# Patient Record
Sex: Male | Born: 1955
Health system: Southern US, Community
[De-identification: ages and names within clinical notes are randomized; demographics above are authoritative.]

## PROBLEM LIST (undated history)

## (undated) DIAGNOSIS — J45909 Unspecified asthma, uncomplicated: Secondary | ICD-10-CM

## (undated) DIAGNOSIS — F32A Depression, unspecified: Secondary | ICD-10-CM

## (undated) DIAGNOSIS — G4730 Sleep apnea, unspecified: Secondary | ICD-10-CM

## (undated) DIAGNOSIS — D1720 Benign lipomatous neoplasm of skin and subcutaneous tissue of unspecified limb: Secondary | ICD-10-CM

## (undated) DIAGNOSIS — C499 Malignant neoplasm of connective and soft tissue, unspecified: Secondary | ICD-10-CM

## (undated) DIAGNOSIS — F329 Major depressive disorder, single episode, unspecified: Secondary | ICD-10-CM

## (undated) DIAGNOSIS — E119 Type 2 diabetes mellitus without complications: Secondary | ICD-10-CM

## (undated) HISTORY — PX: COLONOSCOPY: SHX174

## (undated) HISTORY — DX: Unspecified asthma, uncomplicated: J45.909

## (undated) HISTORY — DX: Depression, unspecified: F32.A

## (undated) HISTORY — DX: Sleep apnea, unspecified: G47.30

## (undated) HISTORY — DX: Benign lipomatous neoplasm of skin and subcutaneous tissue of unspecified limb: D17.20

## (undated) HISTORY — DX: Malignant neoplasm of connective and soft tissue, unspecified: C49.9

## (undated) HISTORY — DX: Type 2 diabetes mellitus without complications: E11.9

---
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
## 1998-10-10 HISTORY — PX: INGUINAL HERNIA REPAIR: SHX194

## 1999-08-17 ENCOUNTER — Ambulatory Visit (HOSPITAL_COMMUNITY): Admission: RE | Admit: 1999-08-17 | Discharge: 1999-08-17 | Payer: Self-pay | Admitting: Surgery

## 2004-09-20 ENCOUNTER — Ambulatory Visit: Payer: Self-pay | Admitting: Internal Medicine

## 2004-10-12 ENCOUNTER — Ambulatory Visit: Payer: Self-pay | Admitting: Internal Medicine

## 2004-10-19 ENCOUNTER — Ambulatory Visit: Payer: Self-pay | Admitting: Internal Medicine

## 2005-06-15 ENCOUNTER — Ambulatory Visit: Payer: Self-pay | Admitting: Internal Medicine

## 2005-06-29 ENCOUNTER — Ambulatory Visit: Payer: Self-pay | Admitting: Internal Medicine

## 2005-09-26 ENCOUNTER — Ambulatory Visit: Payer: Self-pay | Admitting: Internal Medicine

## 2006-08-08 ENCOUNTER — Ambulatory Visit: Payer: Self-pay | Admitting: Internal Medicine

## 2006-09-18 ENCOUNTER — Ambulatory Visit: Payer: Self-pay | Admitting: Internal Medicine

## 2006-09-18 LAB — CONVERTED CEMR LAB
Chol/HDL Ratio, serum: 4.9
Cholesterol: 207 mg/dL (ref 0–200)
GFR calc non Af Amer: 57 mL/min
Glomerular Filtration Rate, Af Am: 70 mL/min/{1.73_m2}
Glucose, Bld: 96 mg/dL (ref 70–99)
PSA: 0.34 ng/mL (ref 0.10–4.00)
Potassium: 4.3 meq/L (ref 3.5–5.1)
Sodium: 141 meq/L (ref 135–145)
Triglyceride fasting, serum: 137 mg/dL (ref 0–149)
VLDL: 27 mg/dL (ref 0–40)

## 2007-09-20 ENCOUNTER — Ambulatory Visit: Payer: Self-pay | Admitting: Internal Medicine

## 2008-05-21 ENCOUNTER — Telehealth (INDEPENDENT_AMBULATORY_CARE_PROVIDER_SITE_OTHER): Payer: Self-pay | Admitting: *Deleted

## 2008-05-21 ENCOUNTER — Ambulatory Visit: Payer: Self-pay | Admitting: Internal Medicine

## 2009-01-26 ENCOUNTER — Ambulatory Visit: Payer: Self-pay | Admitting: Internal Medicine

## 2009-01-29 LAB — CONVERTED CEMR LAB
ALT: 28 units/L (ref 0–53)
AST: 23 units/L (ref 0–37)
BUN: 15 mg/dL (ref 6–23)
Cholesterol: 214 mg/dL — ABNORMAL HIGH (ref 0–200)
Creatinine, Ser: 1.2 mg/dL (ref 0.4–1.5)
Eosinophils Relative: 2 % (ref 0.0–5.0)
GFR calc non Af Amer: 82.19 mL/min (ref >60)
HDL: 43 mg/dL (ref >39.00)
Monocytes Relative: 7.9 % (ref 3.0–12.0)
Neutrophils Relative %: 50.9 % (ref 43.0–77.0)
Platelets: 252 10*3/uL (ref 150.0–400.0)
Potassium: 4.2 meq/L (ref 3.5–5.1)
TSH: 0.84 microintl units/mL (ref 0.35–5.50)
Triglycerides: 179 mg/dL — ABNORMAL HIGH (ref 0.0–149.0)
VLDL: 35.8 mg/dL (ref 0.0–40.0)
WBC: 5.4 10*3/uL (ref 4.5–10.5)

## 2009-09-30 ENCOUNTER — Encounter (INDEPENDENT_AMBULATORY_CARE_PROVIDER_SITE_OTHER): Payer: Self-pay | Admitting: *Deleted

## 2010-05-17 ENCOUNTER — Ambulatory Visit: Payer: Self-pay | Admitting: Internal Medicine

## 2010-05-18 ENCOUNTER — Encounter (INDEPENDENT_AMBULATORY_CARE_PROVIDER_SITE_OTHER): Payer: Self-pay | Admitting: *Deleted

## 2010-05-18 ENCOUNTER — Telehealth: Payer: Self-pay | Admitting: Internal Medicine

## 2010-05-19 ENCOUNTER — Telehealth (INDEPENDENT_AMBULATORY_CARE_PROVIDER_SITE_OTHER): Payer: Self-pay | Admitting: *Deleted

## 2010-05-20 ENCOUNTER — Encounter: Payer: Self-pay | Admitting: Internal Medicine

## 2010-05-21 LAB — CONVERTED CEMR LAB
Albumin: 3.9 g/dL (ref 3.5–5.2)
Alkaline Phosphatase: 44 units/L (ref 39–117)
Basophils Absolute: 0 10*3/uL (ref 0.0–0.1)
CO2: 24 meq/L (ref 19–32)
Calcium: 8.9 mg/dL (ref 8.4–10.5)
Creatinine, Ser: 1.2 mg/dL (ref 0.4–1.5)
Eosinophils Absolute: 0.1 10*3/uL (ref 0.0–0.7)
Glucose, Bld: 92 mg/dL (ref 70–99)
HDL: 43.8 mg/dL (ref >39.00)
Hemoglobin: 13.9 g/dL (ref 13.0–17.0)
Lymphocytes Relative: 64.8 % — ABNORMAL HIGH (ref 12.0–46.0)
MCHC: 33.8 g/dL (ref 30.0–36.0)
Neutro Abs: 0.4 10*3/uL — ABNORMAL LOW (ref 1.4–7.7)
Neutrophils Relative %: 17.9 % — ABNORMAL LOW (ref 43.0–77.0)
PSA: 0.41 ng/mL (ref 0.10–4.00)
RDW: 15.2 % — ABNORMAL HIGH (ref 11.5–14.6)
Triglycerides: 186 mg/dL — ABNORMAL HIGH (ref 0.0–149.0)
VLDL: 37.2 mg/dL (ref 0.0–40.0)

## 2010-05-24 ENCOUNTER — Telehealth: Payer: Self-pay | Admitting: Internal Medicine

## 2010-05-26 ENCOUNTER — Telehealth (INDEPENDENT_AMBULATORY_CARE_PROVIDER_SITE_OTHER): Payer: Self-pay | Admitting: *Deleted

## 2010-05-26 DIAGNOSIS — D72819 Decreased white blood cell count, unspecified: Secondary | ICD-10-CM | POA: Insufficient documentation

## 2010-05-31 ENCOUNTER — Encounter: Payer: Self-pay | Admitting: Internal Medicine

## 2010-06-22 ENCOUNTER — Ambulatory Visit: Payer: Self-pay | Admitting: Internal Medicine

## 2010-06-28 LAB — CONVERTED CEMR LAB
Basophils Absolute: 0 10*3/uL (ref 0.0–0.1)
Eosinophils Absolute: 0.1 10*3/uL (ref 0.0–0.7)
HCT: 40 % (ref 39.0–52.0)
Hemoglobin: 13.5 g/dL (ref 13.0–17.0)
Lymphs Abs: 1.9 10*3/uL (ref 0.7–4.0)
MCHC: 33.8 g/dL (ref 30.0–36.0)
MCV: 82.1 fL (ref 78.0–100.0)
Monocytes Absolute: 0.3 10*3/uL (ref 0.1–1.0)
Neutro Abs: 2.9 10*3/uL (ref 1.4–7.7)
RDW: 15.8 % — ABNORMAL HIGH (ref 11.5–14.6)

## 2010-07-22 ENCOUNTER — Encounter (INDEPENDENT_AMBULATORY_CARE_PROVIDER_SITE_OTHER): Payer: Self-pay | Admitting: *Deleted

## 2010-07-26 ENCOUNTER — Ambulatory Visit: Payer: Self-pay | Admitting: Internal Medicine

## 2010-08-05 ENCOUNTER — Ambulatory Visit: Payer: Self-pay | Admitting: Internal Medicine

## 2010-11-07 LAB — CONVERTED CEMR LAB
AST: 22 units/L (ref 0–37)
BUN: 14 mg/dL (ref 6–23)
Basophils Relative: 0 % (ref 0.0–1.0)
CO2: 26 meq/L (ref 19–32)
Creatinine, Ser: 1.3 mg/dL (ref 0.4–1.5)
Eosinophils Relative: 1.6 % (ref 0.0–5.0)
Glucose, Bld: 107 mg/dL — ABNORMAL HIGH (ref 70–99)
HCT: 41 % (ref 39.0–52.0)
Hemoglobin: 14 g/dL (ref 13.0–17.0)
Lymphocytes Relative: 41.2 % (ref 12.0–46.0)
Monocytes Absolute: 0.4 10*3/uL (ref 0.2–0.7)
Monocytes Relative: 7.5 % (ref 3.0–11.0)
Neutrophils Relative %: 49.7 % (ref 43.0–77.0)
PSA: 0.36 ng/mL (ref 0.10–4.00)
Potassium: 3.9 meq/L (ref 3.5–5.1)
RDW: 13.9 % (ref 11.5–14.6)
TSH: 0.52 microintl units/mL (ref 0.35–5.50)
Total CHOL/HDL Ratio: 5.3
VLDL: 31 mg/dL (ref 0–40)
WBC: 4.8 10*3/uL (ref 4.5–10.5)

## 2010-11-11 NOTE — Letter (Signed)
Summary: Previsit letter  [facility]  [address] [place], [place] [address]   Phone: [phone]  Fax: [phone]       05/31/2010 MRN: [id number]  [facility] [address] [place], [place]  [address]  Dear Mr. [facility],  Welcome to the Gastroenterology Division at [facility].    You are scheduled to see a nurse for your pre-procedure visit on  07-22-10 at 9am on the 3rd floor at [facility], [address]  We ask that you try to arrive at our office 15 minutes prior to your appointment time to allow for check-in.  Your nurse visit will consist of discussing your medical and surgical history, your immediate family medical history, and your medications.    Please bring a complete list of all your medications or, if you prefer, bring the medication bottles and we will list them.  We will need to be aware of both prescribed and over the counter drugs.  We will need to know exact dosage information as well.  If you are on blood thinners (Coumadin, Plavix, Aggrenox, Ticlid, etc.) please call our office today/prior to your appointment, as we need to consult with your physician about holding your medication.   Please be prepared to read and sign documents such as consent forms, a financial agreement, and acknowledgement forms.  If necessary, and with your consent, a friend or relative is welcome to sit-in on the nurse visit with you.  Please bring your insurance card so that we may make a copy of it.  If your insurance requires a referral to see a specialist, please bring your referral form from your primary care physician.  No co-pay is required for this nurse visit.     If you cannot keep your appointment, please call [phone] to cancel or reschedule prior to your appointment date.  This allows [place] the opportunity to schedule an appointment for another patient in need of care.    Thank you for choosing [facility] for your medical needs.  We  appreciate the opportunity to care for you.  Please visit [place] at our website  to learn more about our practice.                     Sincerely.                                                                                                                   The Gastroenterology Division

## 2010-11-11 NOTE — Progress Notes (Signed)
Summary: Schedule Colonoscopy  Phone Note Outgoing Call Call back at [phone]   Call placed by: Harlow Mares CMA Duncan Dull),  May 24, 2010 11:27 AM Call placed to: Patient Summary of Call: Left message on patients machine to call back. patient is due for a colonoscopy due to family hx of colon cancer Initial call taken by: Harlow Mares CMA Duncan Dull),  May 24, 2010 11:27 AM  Follow-up for Phone Call        colonoscopy on 08/05/2010 Follow-up by: Harlow Mares CMA Duncan Dull),  May 31, 2010 9:18 AM

## 2010-11-11 NOTE — Letter (Signed)
Summary: Primary Care Consult Scheduled Letter  [facility]  [address] [place], [place] [address]   Phone: [phone]  Fax: [phone]      05/18/2010 MRN: [id number]  [facility] [address] [place], [place]  [address]    Dear Gary [name],    We have scheduled an appointment for you.  At the recommendation of Dr. Willow Ora, we have scheduled you for a Screening Colonoscopy with [facility].  Your initial Pre-visit with the Nurse is on 07-22-2010 at 9:00am.  Your Colonoscopy is on 08-05-2010 arrive no later than 8:00am with Dr. Stan Head.  Their address is [address], [place] [address]. The office phone number is [phone].  If this appointment day and time is not convenient for you, please feel free to call the office of the doctor you are being referred to at the number listed above and reschedule the appointment.    It is important for you to keep your scheduled appointments. We are here to make sure you are given good patient care.   Thank you,    Renee, Patient Care Coordinator [facility]

## 2010-11-11 NOTE — Letter (Signed)
Summary: Provider Notification Form/[employer]  Provider Notification Form/[employer]   Imported By: Lanelle Bal 05/31/2010 11:11:15  _____________________________________________________________________  External Attachment:    Type:   Image     Comment:   External Document

## 2010-11-11 NOTE — Letter (Signed)
Summary: [facility] Instructions  [facility]  [address] [place], [place] [address]   Phone: [phone]  Fax: [phone]       Gary [name]    30-Apr-1956    MRN: [id number]        Procedure Day /Date:  Thursday 08/05/2010     Arrival Time: 8:00 am      Procedure Time: 9:00 am     Location of Procedure:                    _x _   Endoscopy Center (4th Floor)                        PREPARATION FOR COLONOSCOPY WITH MOVIPREP   Starting 5 days prior to your procedure Saturday 10/22 do not eat nuts, seeds, popcorn, corn, beans, peas,  salads, or any raw vegetables.  Do not take any fiber supplements (e.g. Metamucil, Citrucel, and Benefiber).  THE DAY BEFORE YOUR PROCEDURE         DATE: Wednesday 10/26  1.  Drink clear liquids the entire day-NO SOLID FOOD  2.  Do not drink anything colored red or purple.  Avoid juices with pulp.  No orange juice.  3.  Drink at least 64 oz. (8 glasses) of fluid/clear liquids during the day to prevent dehydration and help the prep work efficiently.  CLEAR LIQUIDS INCLUDE: Water Jello Ice Popsicles Tea (sugar ok, no milk/cream) Powdered fruit flavored drinks Coffee (sugar ok, no milk/cream) Gatorade Juice: apple, white grape, white cranberry  Lemonade Clear bullion, consomm, broth Carbonated beverages (any kind) Strained chicken noodle soup Hard Candy                             4.  In the morning, mix first dose of MoviPrep solution:    Empty 1 Pouch A and 1 Pouch B into the disposable container    Add lukewarm drinking water to the top line of the container. Mix to dissolve    Refrigerate (mixed solution should be used within 24 hrs)  5.  Begin drinking the prep at 5:00 p.m. The MoviPrep container is divided by 4 marks.   Every 15 minutes drink the solution down to the next mark (approximately 8 oz) until the full liter is complete.   6.  Follow completed prep with 16 oz of clear liquid of your choice  (Nothing red or purple).  Continue to drink clear liquids until bedtime.  7.  Before going to bed, mix second dose of MoviPrep solution:    Empty 1 Pouch A and 1 Pouch B into the disposable container    Add lukewarm drinking water to the top line of the container. Mix to dissolve    Refrigerate  THE DAY OF YOUR PROCEDURE      DATE: Thursday 10/27  Beginning at 4:00 a.m. (5 hours before procedure):         1. Every 15 minutes, drink the solution down to the next mark (approx 8 oz) until the full liter is complete.  2. Follow completed prep with 16 oz. of clear liquid of your choice.    3. You may drink clear liquids until 7:00 am (2 HOURS BEFORE PROCEDURE).   MEDICATION INSTRUCTIONS  Unless otherwise instructed, you should take regular prescription medications with a small sip of water   as early as possible the morning of  your procedure.           OTHER INSTRUCTIONS  You will need a responsible adult at least 53 years of age to accompany you and drive you home.   This person must remain in the waiting room during your procedure.  Wear loose fitting clothing that is easily removed.  Leave jewelry and other valuables at home.  However, you may wish to bring a book to read or  an iPod/MP3 player to listen to music as you wait for your procedure to start.  Remove all body piercing jewelry and leave at home.  Total time from sign-in until discharge is approximately 2-3 hours.  You should go home directly after your procedure and rest.  You can resume normal activities the  day after your procedure.  The day of your procedure you should not:   Drive   Make legal decisions   Operate machinery   Drink alcohol   Return to work  You will receive specific instructions about eating, activities and medications before you leave.    The above instructions have been reviewed and explained to me by   Ezra Sites RN  July 26, 2010 9:14 AM     I fully understand  and can verbalize these instructions _____________________________ Date _________

## 2010-11-11 NOTE — Progress Notes (Signed)
Summary: lab result  Phone Note From Other Clinic   Summary of Call: Clydie Braun from [place] lab called to give [place] this result, will fax over results.  WBC- 2.5  Follow-up for Phone Call        noted, patient asx , will see the full CBC  when available Jose E. Paz MD  May 18, 2010 4:42 PM  see labs [place] E. Paz MD  May 20, 2010 6:49 PM

## 2010-11-11 NOTE — Assessment & Plan Note (Signed)
Summary: cpx/cbs   Vital Signs:  Patient profile:   55 year old male Height:      73 inches Weight:      279.13 pounds BMI:     36.96 Pulse rate:   70 / minute Pulse rhythm:   regular BP sitting:   116 / 80  (left arm) Cuff size:   large  Vitals Entered By: Army Fossa CMA (May 17, 2010 1:57 PM)  [facility]: CPX: fasting Comments due for colonoscopy.    History of Present Illness: complete physical exam  Preventive Screening-Counseling & Management  Caffeine-Diet-Exercise     Type of exercise: bike, weights     Times/week: 3  Current Medications (verified): 1)  None  Allergies (verified): No Known Drug Allergies  Past History:  Past Medical History: Reviewed history from 09/20/2007 and no changes required. no  Past Surgical History: Reviewed history from 09/20/2007 and no changes required. Inguinal herniorrhaphy  Family History: Reviewed history from 01/26/2009 and no changes required. Hypertension: MOTHER Colon CA 1st degree relative <60: FATHER Diabetes--MOTHER MI--no prostate ca--no  Social History: Married 4 kids Occupation: works for the [employer] (not sedentary) tobacco--no ETOH-- rarely diet-- has "cut down" fried food  exercise--walks 3/week, weigh lifting 2/week  Review of Systems CV:  Denies chest pain or discomfort, palpitations, shortness of breath with exertion, and swelling of feet. Resp:  Denies cough and wheezing. GI:  Denies bloody stools, diarrhea, nausea, and vomiting. GU:  Denies dysuria, hematuria, urinary frequency, and urinary hesitancy. Psych:  Denies anxiety and depression.  Physical Exam  General:  alert, well-developed, and overweight-appearing.   Neck:  no masses, no thyromegaly, and normal carotid upstroke.   Lungs:  normal respiratory effort, no intercostal retractions, no accessory muscle use, and normal breath sounds.   Heart:  normal rate, regular rhythm, no murmur, and no gallop.   Abdomen:  soft,  non-tender, no distention, no masses, no guarding, no rigidity, and no rebound tenderness.   Rectal:  several skin tags noted. Normal sphincter tone. No rectal masses or tenderness. no stools found Prostate:  exam quite limited by patient's size, prostate feels normal Extremities:  note lower extremity edema Neurologic:  alert & oriented X3, strength normal in all extremities, and gait normal.   Psych:  Cognition and judgment appear intact. Alert and cooperative with normal attention span and concentration. not anxious appearing and not depressed appearing.     Impression & Recommendations:  Problem # 1:  HEALTH SCREENING (ICD-V70.0) Td 01-2009  Cscope 2006 (-) no polyps, d/t  his family history of colon cancer he was recommended to have his next Cscope  2011 -------------> refer  to GI, the patient is somehow reluctant to have another colonoscopy but he agreed to to schedule one later this year  diet and exercise discussed, his BMI continued to be high. Weight Watchers? Labs    Orders: Venipuncture ([address]) TLB-BMP (Basic Metabolic Panel-BMET) (80048-METABOL) TLB-CBC Platelet - w/Differential (85025-CBCD) TLB-Hepatic/Liver Function Pnl (80076-HEPATIC) TLB-Lipid Panel (80061-LIPID) TLB-TSH (Thyroid Stimulating Hormone) (84443-TSH) TLB-PSA (Prostate Specific Antigen) (84153-PSA) Gastroenterology Referral (GI)  Patient Instructions: 1)  Please schedule a follow-up appointment in 1 year.    Risk Factors:  Alcohol use:  yes Exercise:  yes    Times per week:  3    Type:  bike, weights

## 2010-11-11 NOTE — Miscellaneous (Signed)
Summary: [facility]  Clinical Lists Changes  Medications: Added new medication of MOVIPREP 100 GM  SOLR (PEG-KCL-NACL-NASULF-NA ASC-C) As per prep instructions. - Signed Rx of MOVIPREP 100 GM  SOLR (PEG-KCL-NACL-NASULF-NA ASC-C) As per prep instructions.;  #1 x 0;  Signed;  Entered by: Ezra Sites RN;  Authorized by: Iva Boop MD, FACG;  Method used: Electronically to [id number] [address] [place], [place]  [address], Ph: [phone], Fax: [phone] Observations: Added new observation of NKA: T (07/26/2010 8:43)    Prescriptions: MOVIPREP 100 GM  SOLR (PEG-KCL-NACL-NASULF-NA ASC-C) As per prep instructions.  #1 x 0   Entered by:   Ezra Sites RN   Authorized by:   Iva Boop MD, [facility]   Signed by:   Ezra Sites RN on 07/26/2010   Method used:   Electronically to        [id number] (retail)       [address] [place], [place]  [address]       Ph: [phone]       Fax: [phone]   RxID:   [id number]

## 2010-11-11 NOTE — Procedures (Signed)
Summary: Colonoscopy  Patient: Gary [name] Note: All result statuses are Final unless otherwise noted.  Tests: (1) Colonoscopy (COL)   COL Colonoscopy           DONE     [facility]     [address]     [place], [place]  [address]           COLONOSCOPY PROCEDURE REPORT           PATIENT:  Gary [name], Gary [name]  MR#:  [id number]     BIRTHDATE:  22-Nov-1955, 55 yrs. old  GENDER:  male     ENDOSCOPIST:  Iva Boop, MD, [facility]           PROCEDURE DATE:  08/05/2010     PROCEDURE:  Colonoscopy 469[address]     ASA CLASS:  Class I     INDICATIONS:  Elevated Risk Screening, family history of colon     cancer Father, dx age 77     MEDICATIONS:   Fentanyl 75 mcg IV, Versed 6 mg IV           DESCRIPTION OF PROCEDURE:   After the risks benefits and     alternatives of the procedure were thoroughly explained, informed     consent was obtained.  Digital rectal exam was performed and     revealed no abnormalities and normal prostate.   The [id number] [id number]     endoscope was introduced through the anus and advanced to the     cecum, which was identified by both the appendix and ileocecal     valve, without limitations.  The quality of the prep was     excellent, using MoviPrep.  The instrument was then slowly     withdrawn as the colon was fully examined. Insertion: 4:13 minutes     Withdrawal: 8:30 minutes     <<PROCEDUREIMAGES>>           FINDINGS:  A normal appearing cecum, ileocecal valve, and     appendiceal orifice were identified. The ascending, hepatic     flexure, transverse, splenic flexure, descending, sigmoid colon,     and rectum appeared unremarkable.   Retroflexed views in the     rectum revealed no abnormalities.    The scope was then withdrawn     from the patient and the procedure completed.           COMPLICATIONS:  None     ENDOSCOPIC IMPRESSION:     1) Normal colonoscopy, excellent prep     2) Family history of colon cancer - father           REPEAT EXAM:  In 5 year(s) for  routine screening colonoscopy.           Iva Boop, MD, [facility]           CC:  The Patient           n.     eSIGNED:   Iva Boop at 08/05/2010 09:33 AM           Bronson Ing, [id number]  Note: An exclamation mark (!) indicates a result that was not dispersed into the flowsheet. Document Creation Date: 08/05/2010 9:34 AM _______________________________________________________________________  (1) Order result status: Final Collection or observation date-time: 08/05/2010 09:29 Requested date-time:  Receipt date-time:  Reported date-time:  Referring Physician:   Ordering Physician: Stan Head [id number]) Specimen Source:  Source: Launa Grill Order Number: [id number] Lab site:  Appended Document: Colonoscopy    Clinical Lists Changes  Observations: Added new observation of COLONNXTDUE: 07/2015 (08/05/2010 12:35)

## 2010-11-11 NOTE — Progress Notes (Signed)
Summary: "REWARDS FOR HEALTH PROGRAM" PAPERWORK TO COMPLETE  Phone Note Call from Patient Call back at [phone]   Caller: Patient Complaint: Breathing Problems Summary of Call: PATIENT BROUGHT IN PAPERWORK "REWARDS FOR HEALTH PROGRAM"  WILL TAKE FORM AND BILLING SHEET TO DANIELLE--PLEASE RETURN ALL SHEETS TO [facility] Initial call taken by: Jerolyn Shin,  May 19, 2010 3:44 PM  Follow-up for Phone Call        filled out form, placed on your ledge for you to sign. Army Fossa CMA  May 19, 2010 4:03 PM   Additional Follow-up for Phone Call Additional follow up Details #1::        done [facility] E. 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

## 2010-11-11 NOTE — Progress Notes (Signed)
----   Converted from flag ---- ---- 05/26/2010 8:35 AM, [name] MD wrote: leukopenia  ---- 05/25/2010 9:37 AM, Army Fossa CMA wrote: only thing on problem list is health screening- what code do you want [place] to use to recheck his CBC (his WBC was low) ------------------------------     New Problems: LEUKOPENIA, MILD (ICD-288.50)   New Problems: LEUKOPENIA, MILD (ICD-288.50)

## 2011-06-09 ENCOUNTER — Encounter: Payer: Self-pay | Admitting: *Deleted

## 2011-06-10 ENCOUNTER — Ambulatory Visit (INDEPENDENT_AMBULATORY_CARE_PROVIDER_SITE_OTHER): Payer: 59 | Admitting: Internal Medicine

## 2011-06-10 ENCOUNTER — Encounter: Payer: Self-pay | Admitting: Internal Medicine

## 2011-06-10 VITALS — BP 102/68 | HR 64 | Temp 97.8°F | Resp 16 | Ht 71.25 in | Wt 287.2 lb

## 2011-06-10 DIAGNOSIS — R21 Rash and other nonspecific skin eruption: Secondary | ICD-10-CM | POA: Insufficient documentation

## 2011-06-10 DIAGNOSIS — Z136 Encounter for screening for cardiovascular disorders: Secondary | ICD-10-CM

## 2011-06-10 DIAGNOSIS — Z0000 Encounter for general adult medical examination without abnormal findings: Secondary | ICD-10-CM | POA: Insufficient documentation

## 2011-06-10 LAB — LIPID PANEL
HDL: 47.2 mg/dL (ref >39.00)
Total CHOL/HDL Ratio: 5
VLDL: 33.6 mg/dL (ref 0.0–40.0)

## 2011-06-10 LAB — CBC WITH DIFFERENTIAL/PLATELET
Basophils Absolute: 0 10*3/uL (ref 0.0–0.1)
Basophils Relative: 0.7 % (ref 0.0–3.0)
Eosinophils Relative: 3.1 % (ref 0.0–5.0)
HCT: 44.5 % (ref 39.0–52.0)
Hemoglobin: 14.5 g/dL (ref 13.0–17.0)
Lymphocytes Relative: 35 % (ref 12.0–46.0)
Lymphs Abs: 1.7 10*3/uL (ref 0.7–4.0)
Monocytes Relative: 6.8 % (ref 3.0–12.0)
Neutro Abs: 2.7 10*3/uL (ref 1.4–7.7)
RBC: 5.41 Mil/uL (ref 4.22–5.81)
RDW: 15.1 % — ABNORMAL HIGH (ref 11.5–14.6)
WBC: 4.9 10*3/uL (ref 4.5–10.5)

## 2011-06-10 LAB — PSA: PSA: 0.35 ng/mL (ref 0.10–4.00)

## 2011-06-10 LAB — COMPREHENSIVE METABOLIC PANEL
ALT: 45 U/L (ref 0–53)
BUN: 17 mg/dL (ref 6–23)
CO2: 25 mEq/L (ref 19–32)
Calcium: 9 mg/dL (ref 8.4–10.5)
Chloride: 108 mEq/L (ref 96–112)
Creatinine, Ser: 1.1 mg/dL (ref 0.4–1.5)
GFR: 89.09 mL/min (ref >60.00)
Total Bilirubin: 1.1 mg/dL (ref 0.3–1.2)

## 2011-06-10 LAB — LDL CHOLESTEROL, DIRECT: Direct LDL: 133.5 mg/dL

## 2011-06-10 NOTE — Assessment & Plan Note (Signed)
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

## 2011-06-10 NOTE — Progress Notes (Signed)
  Subjective:    Patient ID: Gary [name], male    DOB: Oct 17, 1955, 55 y.o.   MRN: [id number]  HPI CPX Has a rash , L groin x months, occ itches ; on further questioning the area has been looking darker x a while   Past Medical History  Diagnosis Date  . Family history unremarkable     Past Surgical History  Procedure Date  . Inguinal hernia repair     Family History: Hypertension: MOTHER Colon CA 1st degree relative <60: FATHER (passed) Diabetes--MOTHER MI--no prostate ca--no  Social History: Married, 4 kids Occupation: works for the [employer] (not sedentary) tobacco--no ETOH-- rarely diet--not very healthy lately   exercise--active at work, not as active as before  Review of Systems  Constitutional: Negative for fever and fatigue.  Respiratory: Negative for cough and shortness of breath.   Cardiovascular: Negative for chest pain and leg swelling.  Gastrointestinal: Negative for nausea, vomiting and abdominal pain.  Genitourinary: Negative for dysuria, hematuria and difficulty urinating.  Psychiatric/Behavioral:       No anxiety-depression        Objective:   Physical Exam  Constitutional: He is oriented to person, place, and time. He appears well-developed and well-nourished. No distress.  HENT:  Head: Normocephalic and atraumatic.  Neck: No thyromegaly present.  Cardiovascular: Normal rate, regular rhythm and normal heart sounds.   No murmur heard. Pulmonary/Chest: Effort normal and breath sounds normal. No respiratory distress. He has no wheezes.  Abdominal: Soft. Bowel sounds are normal. He exhibits no distension. There is no tenderness. There is no rebound and no guarding.  Genitourinary: Rectum normal.       Prostate hard to reach d/t pt habitus but felt normal  Musculoskeletal: He exhibits no edema.  Neurological: He is alert and oriented to person, place, and time.  Skin: He is not diaphoretic.       Symmetric velvety aspect of the groins , on the  left there is a superimposed red rash w/ elevated, well demarcated and slt scally borders    Psychiatric: He has a normal mood and affect. His behavior is normal. Judgment and thought content normal.          Assessment & Plan:

## 2011-06-10 NOTE — Assessment & Plan Note (Addendum)
Td 01-2009 Cscope 2006 (-) no polyps, second Cscope 07-2010 (-), next 5 years d/t FH (Dr Leone Payor) diet and exercise discussed, his BMI continued to be high.   Labs  EKG nsr

## 2011-06-10 NOTE — Patient Instructions (Signed)
Lamisil OTC twice a day x 1 month to the Left groin even if it looks better

## 2011-08-26 ENCOUNTER — Telehealth: Payer: Self-pay

## 2011-08-26 NOTE — Telephone Encounter (Signed)
Left message to notify pt docs ready to pick up

## 2012-02-03 ENCOUNTER — Ambulatory Visit (HOSPITAL_COMMUNITY)
Admission: RE | Admit: 2012-02-03 | Discharge: 2012-02-03 | Disposition: A | Discharge status: Home / Self Care | Payer: 59 | Source: Ambulatory Visit | Attending: Chiropractic Medicine | Admitting: Chiropractic Medicine

## 2012-02-03 ENCOUNTER — Other Ambulatory Visit (HOSPITAL_COMMUNITY): Payer: Self-pay | Admitting: Chiropractic Medicine

## 2012-02-03 DIAGNOSIS — M545 Low back pain, unspecified: Secondary | ICD-10-CM | POA: Insufficient documentation

## 2012-02-03 DIAGNOSIS — M25569 Pain in unspecified knee: Secondary | ICD-10-CM | POA: Insufficient documentation

## 2012-02-03 DIAGNOSIS — M25469 Effusion, unspecified knee: Secondary | ICD-10-CM | POA: Insufficient documentation

## 2012-02-03 DIAGNOSIS — M5380 Other specified dorsopathies, site unspecified: Secondary | ICD-10-CM | POA: Insufficient documentation

## 2012-02-07 ENCOUNTER — Other Ambulatory Visit (HOSPITAL_COMMUNITY): Payer: Self-pay | Admitting: Chiropractic Medicine

## 2012-05-10 ENCOUNTER — Encounter: Payer: Self-pay | Admitting: Internal Medicine

## 2012-05-10 ENCOUNTER — Ambulatory Visit (INDEPENDENT_AMBULATORY_CARE_PROVIDER_SITE_OTHER): Payer: 59 | Admitting: Internal Medicine

## 2012-05-10 VITALS — BP 124/82 | HR 56 | Temp 97.9°F | Ht 72.75 in | Wt 266.0 lb

## 2012-05-10 DIAGNOSIS — Z0000 Encounter for general adult medical examination without abnormal findings: Secondary | ICD-10-CM

## 2012-05-10 LAB — CBC WITH DIFFERENTIAL/PLATELET
Basophils Absolute: 0 10*3/uL (ref 0.0–0.1)
Eosinophils Relative: 2 % (ref 0.0–5.0)
Monocytes Relative: 8.7 % (ref 3.0–12.0)
Neutrophils Relative %: 53 % (ref 43.0–77.0)
Platelets: 259 10*3/uL (ref 150.0–400.0)
RDW: 15 % — ABNORMAL HIGH (ref 11.5–14.6)
WBC: 4.9 10*3/uL (ref 4.5–10.5)

## 2012-05-10 LAB — PSA: PSA: 0.39 ng/mL (ref 0.10–4.00)

## 2012-05-10 LAB — LIPID PANEL: Triglycerides: 104 mg/dL (ref 0.0–149.0)

## 2012-05-10 LAB — COMPREHENSIVE METABOLIC PANEL
Albumin: 4.1 g/dL (ref 3.5–5.2)
CO2: 25 mEq/L (ref 19–32)
Calcium: 9.1 mg/dL (ref 8.4–10.5)
Chloride: 106 mEq/L (ref 96–112)
GFR: 83.55 mL/min (ref >60.00)
Glucose, Bld: 100 mg/dL — ABNORMAL HIGH (ref 70–99)
Potassium: 4.3 mEq/L (ref 3.5–5.1)
Sodium: 138 mEq/L (ref 135–145)
Total Protein: 7.3 g/dL (ref 6.0–8.3)

## 2012-05-10 LAB — TSH: TSH: 0.65 u[IU]/mL (ref 0.35–5.50)

## 2012-05-10 NOTE — Assessment & Plan Note (Addendum)
Td 01-2009 Cscope 2006 (-) no polyps, second Cscope 07-2010 (-), next 5 years d/t FH (Dr Leone Payor) diet and exercise -- doing great, has lost 20 pounds. Thinks his lifestyle changes are sustainable. Labs

## 2012-05-10 NOTE — Progress Notes (Signed)
  Subjective:    Patient ID: Gary [name], male    DOB: 02-04-1956, 56 y.o.   MRN: [id number]  HPI CPX  Past medical history Negative  Past surgical history Inguinal hernia repair, 2000  Family History: Hypertension: MOTHER Colon CA   <60: FATHER (passed) Diabetes--MOTHER MI--no prostate ca--no  Social History: Married, 4 kids Works for the [employer] (not sedentary) tobacco--no ETOH-- rarely Diet, exercise-- better , has lost 20 pounds    Review of Systems  Respiratory: Negative for cough and shortness of breath.   Cardiovascular: Negative for chest pain and leg swelling.  Gastrointestinal: Negative for nausea, abdominal pain and diarrhea.  Genitourinary: Negative for dysuria, hematuria and difficulty urinating.  Psychiatric/Behavioral:       No depression or anxiety        Objective:   Physical Exam  General -- alert, well-developed Neck --no thyromegaly , normal carotid pulse Lungs -- normal respiratory effort, no intercostal retractions, no accessory muscle use, and normal breath sounds.   Heart-- normal rate, regular rhythm, no murmur, and no gallop.   Abdomen--soft, non-tender, no distention, no masses, no HSM, no guarding, and no rigidity.   Extremities-- no pretibial edema bilaterally Rectal-- No external abnormalities noted. Normal sphincter tone. No rectal masses or tenderness. Brown stool  Prostate:  Prostate gland is hard to reach  however firm and smooth, no enlargement, nodularity, tenderness, mass, asymmetry or induration. Neurologic-- alert & oriented X3 and strength normal in all extremities. Psych-- Cognition and judgment appear intact. Alert and cooperative with normal attention span and concentration.  not anxious appearing and not depressed appearing.       Assessment & Plan:

## 2012-05-14 ENCOUNTER — Encounter: Payer: Self-pay | Admitting: *Deleted

## 2012-06-22 ENCOUNTER — Encounter: Payer: 59 | Admitting: Internal Medicine

## 2013-03-21 ENCOUNTER — Ambulatory Visit (INDEPENDENT_AMBULATORY_CARE_PROVIDER_SITE_OTHER): Payer: 59 | Admitting: Internal Medicine

## 2013-03-21 VITALS — BP 118/82 | HR 59 | Temp 98.5°F | Ht 73.0 in | Wt 287.0 lb

## 2013-03-21 DIAGNOSIS — R0989 Other specified symptoms and signs involving the circulatory and respiratory systems: Secondary | ICD-10-CM

## 2013-03-21 DIAGNOSIS — Z0000 Encounter for general adult medical examination without abnormal findings: Secondary | ICD-10-CM

## 2013-03-21 LAB — CBC WITH DIFFERENTIAL/PLATELET
Basophils Relative: 0.3 % (ref 0.0–3.0)
Eosinophils Absolute: 0.1 10*3/uL (ref 0.0–0.7)
Eosinophils Relative: 2.2 % (ref 0.0–5.0)
HCT: 41.1 % (ref 39.0–52.0)
Lymphs Abs: 1.9 10*3/uL (ref 0.7–4.0)
MCHC: 33.3 g/dL (ref 30.0–36.0)
MCV: 83 fl (ref 78.0–100.0)
Monocytes Absolute: 0.4 10*3/uL (ref 0.1–1.0)
Neutro Abs: 3.7 10*3/uL (ref 1.4–7.7)
Neutrophils Relative %: 60.2 % (ref 43.0–77.0)
RBC: 4.95 Mil/uL (ref 4.22–5.81)
WBC: 6.1 10*3/uL (ref 4.5–10.5)

## 2013-03-21 LAB — COMPREHENSIVE METABOLIC PANEL
AST: 29 U/L (ref 0–37)
Albumin: 4 g/dL (ref 3.5–5.2)
Alkaline Phosphatase: 40 U/L (ref 39–117)
BUN: 10 mg/dL (ref 6–23)
Potassium: 4.2 mEq/L (ref 3.5–5.1)
Sodium: 140 mEq/L (ref 135–145)
Total Bilirubin: 1 mg/dL (ref 0.3–1.2)
Total Protein: 7.1 g/dL (ref 6.0–8.3)

## 2013-03-21 LAB — LDL CHOLESTEROL, DIRECT: Direct LDL: 158.4 mg/dL

## 2013-03-21 LAB — LIPID PANEL
Total CHOL/HDL Ratio: 5
Triglycerides: 161 mg/dL — ABNORMAL HIGH (ref 0.0–149.0)

## 2013-03-21 NOTE — Assessment & Plan Note (Addendum)
Td 01-2009 zostavax discussed  Cscope 2006 (-) no polyps, second Cscope 07-2010 (-), next 5 years d/t FH (Dr Leone Payor) Diet and exercise discussed,was able to lose weight last year, has gained it back  Very hard to reach the right carotid artery, we will schedule ultrasound to be sure but doubt she has carotid disease.   Labs  addendum: occ problems w/ erection "takes longer than before", other times is ok, rec to call if sx increase , ?viagra

## 2013-03-21 NOTE — Progress Notes (Signed)
  Subjective:    Patient ID: Gary [name], male    DOB: 10-22-1955, 57 y.o.   MRN: [id number]  HPI CPX  Past medical history  Negative   Past surgical history  Inguinal hernia repair, 2000   Family History:  Hypertension: MOTHER  Colon CA <60: FATHER (passed)  Diabetes--MOTHER  MI--no  prostate ca--no   Social History:  Married, 4 kids  Works for the [employer] (not sedentary)  tobacco--no  ETOH-- rarely     Review of Systems Diet, exercise-- last year lost 20 pounds , has gained wt back No chest pain or shortness or breath No  nausea, vomiting, diarrhea or blood in the stool. No dysuria gross hematuria. No headaches, dizziness or double vision.     Objective:   Physical Exam BP 118/82  Pulse 59  Temp(Src) 98.5 F (36.9 C) (Oral)  Ht 6\' 1"  (1.854 m)  Wt 287 lb (130.182 kg)  BMI 37.87 kg/m2  SpO2 97% General -- alert, well-developed, NAD .   Neck --no thyromegaly , normal L carotid pulse, hard to find the R one Lungs -- normal respiratory effort, no intercostal retractions, no accessory muscle use, and normal breath sounds.   Heart-- normal rate, regular rhythm, no murmur, and no gallop.   Abdomen--soft, non-tender, no distention, no masses, no HSM, no guarding, and no rigidity.   Extremities-- no pretibial edema bilaterally Rectal-- skin tag  noted. Normal sphincter tone. No rectal masses or tenderness. Brown stool Prostate:  Prostate gland hard to reach but not tender,no nodularity,   mass, asymmetry or induration. Neurologic-- alert & oriented X3 and strength normal in all extremities. Psych-- Cognition and judgment appear intact. Alert and cooperative with normal attention span and concentration.  not anxious appearing and not depressed appearing.         Assessment & Plan:

## 2013-03-22 ENCOUNTER — Encounter: Payer: Self-pay | Admitting: Internal Medicine

## 2013-03-22 LAB — PSA: PSA: 0.28 ng/mL (ref 0.10–4.00)

## 2013-03-22 LAB — TSH: TSH: 0.45 u[IU]/mL (ref 0.35–5.50)

## 2013-03-25 ENCOUNTER — Encounter: Payer: Self-pay | Admitting: *Deleted

## 2013-03-26 NOTE — Addendum Note (Signed)
Addended by: Edwena Felty T on: 03/26/2013 08:22 AM   Modules accepted: Orders

## 2013-03-28 ENCOUNTER — Encounter (INDEPENDENT_AMBULATORY_CARE_PROVIDER_SITE_OTHER): Payer: 59

## 2013-03-28 DIAGNOSIS — R0989 Other specified symptoms and signs involving the circulatory and respiratory systems: Secondary | ICD-10-CM

## 2013-04-01 ENCOUNTER — Encounter: Payer: Self-pay | Admitting: *Deleted

## 2014-04-09 ENCOUNTER — Encounter (HOSPITAL_COMMUNITY): Payer: Self-pay | Admitting: Emergency Medicine

## 2014-04-09 ENCOUNTER — Emergency Department (HOSPITAL_COMMUNITY)
Admission: EM | Admit: 2014-04-09 | Discharge: 2014-04-09 | Disposition: A | Discharge status: Home / Self Care | Payer: 59 | Attending: Emergency Medicine | Admitting: Emergency Medicine

## 2014-04-09 DIAGNOSIS — S46819A Strain of other muscles, fascia and tendons at shoulder and upper arm level, unspecified arm, initial encounter: Secondary | ICD-10-CM

## 2014-04-09 DIAGNOSIS — Y92410 Unspecified street and highway as the place of occurrence of the external cause: Secondary | ICD-10-CM | POA: Insufficient documentation

## 2014-04-09 DIAGNOSIS — S161XXA Strain of muscle, fascia and tendon at neck level, initial encounter: Secondary | ICD-10-CM

## 2014-04-09 DIAGNOSIS — S139XXA Sprain of joints and ligaments of unspecified parts of neck, initial encounter: Secondary | ICD-10-CM | POA: Insufficient documentation

## 2014-04-09 DIAGNOSIS — T148XXA Other injury of unspecified body region, initial encounter: Secondary | ICD-10-CM

## 2014-04-09 DIAGNOSIS — Y9389 Activity, other specified: Secondary | ICD-10-CM | POA: Insufficient documentation

## 2014-04-09 DIAGNOSIS — S43499A Other sprain of unspecified shoulder joint, initial encounter: Secondary | ICD-10-CM | POA: Insufficient documentation

## 2014-04-09 DIAGNOSIS — Z79899 Other long term (current) drug therapy: Secondary | ICD-10-CM | POA: Insufficient documentation

## 2014-04-09 MED ORDER — HYDROCODONE-ACETAMINOPHEN 5-325 MG PO TABS
1.0000 | ORAL_TABLET | Freq: Four times a day (QID) | ORAL | Status: DC | PRN
Start: 2014-04-09 — End: 2015-01-08

## 2014-04-09 NOTE — Discharge Instructions (Signed)
Cervical Sprain °A cervical sprain is an injury in the neck in which the strong, fibrous tissues (ligaments) that connect your neck bones stretch or tear. Cervical sprains can range from mild to severe. Severe cervical sprains can cause the neck vertebrae to be unstable. This can lead to damage of the spinal cord and can result in serious nervous system problems. The amount of time it takes for a cervical sprain to get better depends on the cause and extent of the injury. Most cervical sprains heal in 1 to 3 weeks. °CAUSES  °Severe cervical sprains may be caused by:  °· Contact sport injuries (such as from football, rugby, wrestling, hockey, auto racing, gymnastics, diving, martial arts, or boxing).   °· Motor vehicle collisions.   °· Whiplash injuries. This is an injury from a sudden forward and backward whipping movement of the head and neck.  °· Falls.   °Mild cervical sprains may be caused by:  °· Being in an awkward position, such as while cradling a telephone between your ear and shoulder.   °· Sitting in a chair that does not offer proper support.   °· Working at a poorly designed computer station.   °· Looking up or down for long periods of time.   °SYMPTOMS  °· Pain, soreness, stiffness, or a burning sensation in the front, back, or sides of the neck. This discomfort may develop immediately after the injury or slowly, 24 hours or more after the injury.   °· Pain or tenderness directly in the middle of the back of the neck.   °· Shoulder or upper back pain.   °· Limited ability to move the neck.   °· Headache.   °· Dizziness.   °· Weakness, numbness, or tingling in the hands or arms.   °· Muscle spasms.   °· Difficulty swallowing or chewing.   °· Tenderness and swelling of the neck.   °DIAGNOSIS  °Most of the time your health care provider can diagnose a cervical sprain by taking your history and doing a physical exam. Your health care provider will ask about previous neck injuries and any known neck  problems, such as arthritis in the neck. X-rays may be taken to find out if there are any other problems, such as with the bones of the neck. Other tests, such as a CT scan or MRI, may also be needed.  °TREATMENT  °Treatment depends on the severity of the cervical sprain. Mild sprains can be treated with rest, keeping the neck in place (immobilization), and pain medicines. Severe cervical sprains are immediately immobilized. Further treatment is done to help with pain, muscle spasms, and other symptoms and may include: °· Medicines, such as pain relievers, numbing medicines, or muscle relaxants.   °· Physical therapy. This may involve stretching exercises, strengthening exercises, and posture training. Exercises and improved posture can help stabilize the neck, strengthen muscles, and help stop symptoms from returning.   °HOME CARE INSTRUCTIONS  °· Put ice on the injured area.   °¨ Put ice in a plastic bag.   °¨ Place a towel between your skin and the bag.   °¨ Leave the ice on for 15-20 minutes, 3-4 times a day.   °· If your injury was severe, you may have been given a cervical collar to wear. A cervical collar is a two-piece collar designed to keep your neck from moving while it heals. °¨ Do not remove the collar unless instructed by your health care provider. °¨ If you have long hair, keep it outside of the collar. °¨ Ask your health care provider before making any adjustments to your collar. Minor   adjustments may be required over time to improve comfort and reduce pressure on your chin or on the back of your head.  Ifyou are allowed to remove the collar for cleaning or bathing, follow your health care provider's instructions on how to do so safely.  Keep your collar clean by wiping it with mild soap and water and drying it completely. If the collar you have been given includes removable pads, remove them every 1-2 days and hand wash them with soap and water. Allow them to air dry. They should be completely  dry before you wear them in the collar.  If you are allowed to remove the collar for cleaning and bathing, wash and dry the skin of your neck. Check your skin for irritation or sores. If you see any, tell your health care provider.  Do not drive while wearing the collar.   Only take over-the-counter or prescription medicines for pain, discomfort, or fever as directed by your health care provider.   Keep all follow-up appointments as directed by your health care provider.   Keep all physical therapy appointments as directed by your health care provider.   Make any needed adjustments to your workstation to promote good posture.   Avoid positions and activities that make your symptoms worse.   Warm up and stretch before being active to help prevent problems.  SEEK MEDICAL CARE IF:   Your pain is not controlled with medicine.   You are unable to decrease your pain medicine over time as planned.   Your activity level is not improving as expected.  SEEK IMMEDIATE MEDICAL CARE IF:   You develop any bleeding.  You develop stomach upset.  You have signs of an allergic reaction to your medicine.   Your symptoms get worse.   You develop new, unexplained symptoms.   You have numbness, tingling, weakness, or paralysis in any part of your body.  MAKE SURE YOU:   Understand these instructions.  Will watch your condition.  Will get help right away if you are not doing well or get worse. Document Released: 07/24/2007 Document Revised: 10/01/2013 Document Reviewed: 04/03/2013 [facility] Patient Information 2015 [place], [place]. This information is not intended to replace advice given to you by your health care provider. Make sure you discuss any questions you have with your health care provider. Shoulder Pain The shoulder is the joint that connects your arms to your body. The bones that form the shoulder joint include the upper arm bone (humerus), the shoulder blade  (scapula), and the collarbone (clavicle). The top of the humerus is shaped like a ball and fits into a rather flat socket on the scapula (glenoid cavity). A combination of muscles and strong, fibrous tissues that connect muscles to bones (tendons) support your shoulder joint and hold the ball in the socket. Small, fluid-filled sacs (bursae) are located in different areas of the joint. They act as cushions between the bones and the overlying soft tissues and help reduce friction between the gliding tendons and the bone as you move your arm. Your shoulder joint allows a wide range of motion in your arm. This range of motion allows you to do things like scratch your back or throw a ball. However, this range of motion also makes your shoulder more prone to pain from overuse and injury. Causes of shoulder pain can originate from both injury and overuse and usually can be grouped in the following four categories:  Redness, swelling, and pain (inflammation) of the tendon (tendinitis)  or the bursae (bursitis).  Instability, such as a dislocation of the joint.  Inflammation of the joint (arthritis).  Broken bone (fracture). HOME CARE INSTRUCTIONS   Apply ice to the sore area.  Put ice in a plastic bag.  Place a towel between your skin and the bag.  Leave the ice on for 15-20 minutes, 3-4 times per day for the first 2 days, or as directed by your health care provider.  Stop using cold packs if they do not help with the pain.  If you have a shoulder sling or immobilizer, wear it as long as your caregiver instructs. Only remove it to shower or bathe. Move your arm as little as possible, but keep your hand moving to prevent swelling.  Squeeze a soft ball or foam pad as much as possible to help prevent swelling.  Only take over-the-counter or prescription medicines for pain, discomfort, or fever as directed by your caregiver. SEEK MEDICAL CARE IF:   Your shoulder pain increases, or new pain develops  in your arm, hand, or fingers.  Your hand or fingers become cold and numb.  Your pain is not relieved with medicines. SEEK IMMEDIATE MEDICAL CARE IF:   Your arm, hand, or fingers are numb or tingling.  Your arm, hand, or fingers are significantly swollen or turn white or blue. MAKE SURE YOU:   Understand these instructions.  Will watch your condition.  Will get help right away if you are not doing well or get worse. Document Released: 07/06/2005 Document Revised: 10/01/2013 Document Reviewed: 09/10/2011 [facility] Patient Information 2015 [place], [place]. This information is not intended to replace advice given to you by your health care provider. Make sure you discuss any questions you have with your health care provider.

## 2014-04-09 NOTE — ED Provider Notes (Signed)
Medical screening examination/treatment/procedure(s) were performed by non-physician practitioner and as supervising physician I was immediately available for consultation/collaboration.   EKG Interpretation None       Virgel Manifold, MD 04/09/14 1230
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
## 2014-04-10 ENCOUNTER — Encounter: Payer: Self-pay | Admitting: Internal Medicine

## 2014-04-10 ENCOUNTER — Ambulatory Visit (INDEPENDENT_AMBULATORY_CARE_PROVIDER_SITE_OTHER): Payer: 59 | Admitting: Internal Medicine

## 2014-04-10 VITALS — BP 122/76 | HR 60 | Temp 98.0°F | Wt 296.0 lb

## 2014-04-10 DIAGNOSIS — Z5189 Encounter for other specified aftercare: Secondary | ICD-10-CM

## 2014-04-10 DIAGNOSIS — S29012D Strain of muscle and tendon of back wall of thorax, subsequent encounter: Secondary | ICD-10-CM

## 2014-04-10 DIAGNOSIS — S239XXA Sprain of unspecified parts of thorax, initial encounter: Secondary | ICD-10-CM

## 2014-04-10 MED ORDER — PREDNISONE 10 MG PO TABS
ORAL_TABLET | ORAL | Status: DC
Start: 2014-04-10 — End: 2015-01-08

## 2014-04-10 MED ORDER — CYCLOBENZAPRINE HCL 10 MG PO TABS: 10.0000 mg | ORAL_TABLET | Freq: Every evening | ORAL | Status: DC | PRN

## 2014-04-10 NOTE — Progress Notes (Signed)
Pre visit review using our clinic review tool, if applicable. No additional management support is needed unless otherwise documented below in the visit note. 
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
## 2014-04-10 NOTE — Patient Instructions (Signed)
Take prednisone as prescribed  For pain take : Motrin 200 mg 2 tablets every 6 hours as needed . Always take it with food because may cause gastritis and ulcers. If you notice nausea, stomach pain, change in the color of stools --->  Stop the medicine and let us know   or Tylenol  500 mg OTC 2 tabs a day every 8 hours as needed    Flexeril, a muscle relaxant, at bedtime if needed  Also use hydrocodone at bedtime if the pain is severe  Use a heating pad  Call if not improving in the next 10 days to 2 weeks

## 2014-04-11 ENCOUNTER — Encounter: Payer: Self-pay | Admitting: Internal Medicine

## 2014-07-16 ENCOUNTER — Encounter: Payer: 59 | Admitting: Internal Medicine

## 2014-10-07 ENCOUNTER — Encounter: Payer: Self-pay | Admitting: Internal Medicine

## 2014-10-10 DIAGNOSIS — D1720 Benign lipomatous neoplasm of skin and subcutaneous tissue of unspecified limb: Secondary | ICD-10-CM

## 2014-10-10 HISTORY — DX: Benign lipomatous neoplasm of skin and subcutaneous tissue of unspecified limb: D17.20

## 2015-01-08 ENCOUNTER — Other Ambulatory Visit: Payer: Self-pay

## 2015-01-08 ENCOUNTER — Telehealth: Payer: Self-pay | Admitting: *Deleted

## 2015-01-08 NOTE — Telephone Encounter (Signed)
Unable to reach patient at time of Pre-Visit Call.  Left message for patient to return call when available.    

## 2015-01-09 ENCOUNTER — Encounter: Payer: Self-pay | Admitting: Internal Medicine

## 2015-01-09 ENCOUNTER — Ambulatory Visit (INDEPENDENT_AMBULATORY_CARE_PROVIDER_SITE_OTHER): Payer: 59 | Admitting: Internal Medicine

## 2015-01-09 VITALS — BP 130/72 | HR 66 | Temp 98.1°F | Ht 74.0 in | Wt 300.0 lb

## 2015-01-09 DIAGNOSIS — Z125 Encounter for screening for malignant neoplasm of prostate: Secondary | ICD-10-CM | POA: Diagnosis not present

## 2015-01-09 DIAGNOSIS — Z0000 Encounter for general adult medical examination without abnormal findings: Secondary | ICD-10-CM

## 2015-01-09 DIAGNOSIS — Z114 Encounter for screening for human immunodeficiency virus [HIV]: Secondary | ICD-10-CM

## 2015-01-09 LAB — CBC WITH DIFFERENTIAL/PLATELET
BASOS PCT: 0.5 % (ref 0.0–3.0)
Basophils Absolute: 0 10*3/uL (ref 0.0–0.1)
EOS PCT: 1.2 % (ref 0.0–5.0)
Eosinophils Absolute: 0.1 10*3/uL (ref 0.0–0.7)
HCT: 43.1 % (ref 39.0–52.0)
Hemoglobin: 14.5 g/dL (ref 13.0–17.0)
LYMPHS PCT: 32.8 % (ref 12.0–46.0)
Lymphs Abs: 1.8 10*3/uL (ref 0.7–4.0)
MCHC: 33.6 g/dL (ref 30.0–36.0)
MCV: 79.8 fl (ref 78.0–100.0)
MONOS PCT: 6.6 % (ref 3.0–12.0)
Monocytes Absolute: 0.4 10*3/uL (ref 0.1–1.0)
NEUTROS PCT: 58.9 % (ref 43.0–77.0)
Neutro Abs: 3.3 10*3/uL (ref 1.4–7.7)
Platelets: 258 10*3/uL (ref 150.0–400.0)
RBC: 5.4 Mil/uL (ref 4.22–5.81)
RDW: 15.6 % — ABNORMAL HIGH (ref 11.5–15.5)
WBC: 5.5 10*3/uL (ref 4.0–10.5)

## 2015-01-09 LAB — COMPREHENSIVE METABOLIC PANEL
ALK PHOS: 48 U/L (ref 39–117)
ALT: 23 U/L (ref 0–53)
AST: 15 U/L (ref 0–37)
Albumin: 4 g/dL (ref 3.5–5.2)
BILIRUBIN TOTAL: 0.6 mg/dL (ref 0.2–1.2)
BUN: 19 mg/dL (ref 6–23)
CO2: 27 mEq/L (ref 19–32)
Calcium: 9.1 mg/dL (ref 8.4–10.5)
Chloride: 107 mEq/L (ref 96–112)
Creatinine, Ser: 1.25 mg/dL (ref 0.40–1.50)
GFR: 76.66 mL/min (ref >60.00)
GLUCOSE: 109 mg/dL — AB (ref 70–99)
Potassium: 4 mEq/L (ref 3.5–5.1)
SODIUM: 139 meq/L (ref 135–145)
TOTAL PROTEIN: 6.9 g/dL (ref 6.0–8.3)

## 2015-01-09 LAB — LIPID PANEL
CHOLESTEROL: 228 mg/dL — AB (ref 0–200)
HDL: 44.6 mg/dL (ref >39.00)
LDL Cholesterol: 152 mg/dL — ABNORMAL HIGH (ref 0–99)
NonHDL: 183.4
Total CHOL/HDL Ratio: 5
Triglycerides: 155 mg/dL — ABNORMAL HIGH (ref 0.0–149.0)
VLDL: 31 mg/dL (ref 0.0–40.0)

## 2015-01-09 LAB — TSH: TSH: 0.78 u[IU]/mL (ref 0.35–4.50)

## 2015-01-09 LAB — PSA: PSA: 0.4 ng/mL (ref 0.10–4.00)

## 2015-01-09 NOTE — Patient Instructions (Signed)
Get your blood work before you leave   Come back to the office in 1 year  for a physical exam

## 2015-01-09 NOTE — Progress Notes (Signed)
Pre visit review using our clinic review tool, if applicable. No additional management support is needed unless otherwise documented below in the visit note. 

## 2015-01-09 NOTE — Assessment & Plan Note (Addendum)
Td 01-2009 zostavax discussed before  Cscope 2006 (-) no polyps, second Cscope 07-2010 (-), next 5 years d/t FH (Dr Carlean Purl)  Diet and exercise discussed  Lost his wife to breast cancer last year, recovering emotionally, counseled 03-2013 normal carotid  ultrasound     Labs including HIV

## 2015-01-09 NOTE — Progress Notes (Signed)
Subjective:    Patient ID: Gary [name], male    DOB: 11-Aug-1956, 60 y.o.   MRN: [id number]  DOS:  01/09/2015 Type of visit - description : cpx Interval history: Last year, he lost his wife, wen through a very rough time but is recovering.    Review of Systems Constitutional: No fever, chills. No unexplained wt changes. No unusual sweats HEENT: No dental problems, ear discharge, facial swelling, voice changes. No eye discharge, redness or intolerance to light Respiratory: No wheezing or difficulty breathing. No cough , mucus production Cardiovascular: No CP, leg swelling or palpitations GI: no nausea, vomiting, diarrhea or abdominal pain.  No blood in the stools. No dysphagia   Endocrine: No polyphagia, polyuria or polydipsia GU: No dysuria, gross hematuria, difficulty urinating. occ  urinary urgency but no  Frequency , nocturia x 1-2 Musculoskeletal: No joint swellings or unusual aches or pains Skin: No change in the color of the skin, palor or rash Allergic, immunologic: No environmental allergies or food allergies Neurological: No dizziness or syncope. No headaches. No diplopia, slurred speech, motor deficits, facial numbness Hematological: No enlarged lymph nodes, easy bruising or bleeding Psychiatry: No suicidal ideas, hallucinations, behavior problems or confusion. Lost wife last year, emotionally getting better.  History reviewed. No pertinent past medical history.  Past Surgical History  Procedure Laterality Date  . Inguinal hernia repair  2000    unilateral    History   Social History  . Marital Status: Married    Spouse Name: N/A  . Number of Children: 4  . Years of Education: N/A   Occupational History  . city of [place], [place]     Social History Main Topics  . Smoking status: Never Smoker   . Smokeless tobacco: Not on file  . Alcohol Use: Yes     Comment: rarely  . Drug Use: Not on file  . Sexual Activity: Not on file   Other Topics Concern  . Not on  file   Social History Narrative   Occupation: works for the [employer] (not sedentary)    lost wife 06-2014 , breast cancer   2 children 76-15 y/o live w/ him           Family History  Problem Relation Age of Onset  . Hypertension Mother   . Colon cancer Father     <60  . Diabetes Mother   . Heart attack Neg Hx   . Prostate cancer Neg Hx        Medication List       This list is accurate as of: 01/09/15 11:59 PM.  Always use your most recent med list.               multivitamin with minerals Tabs tablet  Take 1 tablet by mouth daily.           Objective:   Physical Exam BP 130/72 mmHg  Pulse 66  Temp(Src) 98.1 F (36.7 C) (Oral)  Ht 6\' 2"  (1.88 m)  Wt 300 lb (136.079 kg)  BMI 38.50 kg/m2  SpO2 97% General:   Well developed, well nourished . NAD.  Neck:  Full range of motion. Supple. No  Thyromegaly  HEENT:  Normocephalic . Face symmetric, atraumatic Lungs:  CTA B Normal respiratory effort, no intercostal retractions, no accessory muscle use. Heart: RRR,  no murmur.  Abdomen:  Not distended, soft, non-tender. No rebound or rigidity. No mass,organomegaly Muscle skeletal: no pretibial edema bilaterally  Skin: Exposed  areas without rash. Not pale. Not jaundice Rectal:  External abnormalities: Multiple skin tags (unchanged per patient). Normal sphincter tone. No rectal masses or tenderness.  Stool brown  Prostate: Very hard to reach gland, the little  I was able to palpate is normal Neurologic:  alert & oriented X3.  Speech normal, gait appropriate for age and unassisted Strength symmetric and appropriate for age.  Psych: Cognition and judgment appear intact.  Cooperative with normal attention span and concentration.  Behavior appropriate. No anxious or depressed appearing.       Assessment & Plan:

## 2015-01-10 LAB — HIV ANTIBODY (ROUTINE TESTING W REFLEX): HIV: NONREACTIVE

## 2015-01-12 ENCOUNTER — Other Ambulatory Visit (INDEPENDENT_AMBULATORY_CARE_PROVIDER_SITE_OTHER): Payer: 59

## 2015-01-12 DIAGNOSIS — R739 Hyperglycemia, unspecified: Secondary | ICD-10-CM | POA: Diagnosis not present

## 2015-01-12 LAB — HEMOGLOBIN A1C: Hgb A1c MFr Bld: 6.7 % — ABNORMAL HIGH (ref 4.6–6.5)

## 2015-05-15 ENCOUNTER — Ambulatory Visit (INDEPENDENT_AMBULATORY_CARE_PROVIDER_SITE_OTHER): Payer: 59 | Admitting: Internal Medicine

## 2015-05-15 VITALS — BP 128/68 | HR 55 | Temp 97.8°F | Ht 74.0 in | Wt 291.0 lb

## 2015-05-15 DIAGNOSIS — R2232 Localized swelling, mass and lump, left upper limb: Secondary | ICD-10-CM | POA: Diagnosis not present

## 2015-05-15 DIAGNOSIS — E119 Type 2 diabetes mellitus without complications: Secondary | ICD-10-CM

## 2015-05-15 DIAGNOSIS — Z23 Encounter for immunization: Secondary | ICD-10-CM

## 2015-05-15 HISTORY — DX: Type 2 diabetes mellitus without complications: E11.9

## 2015-05-15 LAB — HEMOGLOBIN A1C: Hgb A1c MFr Bld: 6.3 % (ref 4.6–6.5)

## 2015-05-15 NOTE — Progress Notes (Signed)
Pre visit review using our clinic review tool, if applicable. No additional management support is needed unless otherwise documented below in the visit note. 

## 2015-05-15 NOTE — Progress Notes (Signed)
   Subjective:    Patient ID: Gary [name], male    DOB: 09-18-1956, 59 y.o.   MRN: [id number]  DOS:  05/15/2015 Type of visit - description : Follow-up Interval history: Recently diagnosed with diabetes, he started to do better with diet and exercise, did some self learning. Loss -per his own scales- approximately 16 pounds, has regained 5 in the last few weeks. In general feeling well. He did notice a lump at the left tricipital area a year ago, since then it has not increased in size, not tender, not causing any trouble   Review of Systems   No past medical history on file.  Past Surgical History  Procedure Laterality Date  . Inguinal hernia repair  2000    unilateral    History   Social History  . Marital Status: Married    Spouse Name: N/A  . Number of Children: 4  . Years of Education: N/A   Occupational History  . city of [place], water     Social History Main Topics  . Smoking status: Never Smoker   . Smokeless tobacco: Not on file  . Alcohol Use: Yes     Comment: rarely  . Drug Use: Not on file  . Sexual Activity: Not on file   Other Topics Concern  . Not on file   Social History Narrative   Occupation: works for the [employer] (not sedentary)    lost wife 06-2014 , breast cancer   2 children 60-15 y/o live w/ him              Medication List       This list is accurate as of: 05/15/15 12:58 PM.  Always use your most recent med list.               multivitamin with minerals Tabs tablet  Take 1 tablet by mouth daily.           Objective:   Physical Exam  Musculoskeletal:       Arms:  BP 128/68 mmHg  Pulse 55  Temp(Src) 97.8 F (36.6 C) (Oral)  Ht 6\' 2"  (1.88 m)  Wt 291 lb (131.997 kg)  BMI 37.35 kg/m2  SpO2 98% General:   Well developed, well nourished . NAD.  HEENT:  Normocephalic . Face symmetric, atraumatic Diabetes feet exam: No edema, normal pedal pulses, skin normal, pinprick examination normal Neurologic:  alert & oriented  X3.  Speech normal, gait appropriate for age and unassisted Psych--  Cognition and judgment appear intact.  Cooperative with normal attention span and concentration.  Behavior appropriate. No anxious or depressed appearing.      Assessment & Plan:   Mass, arm Most likely a sebaceous cyst, we need some confirmation since this is a new problem. Will refer to sports medicine, possibly an ultrasound will be sufficient to confirm this is a benign condition otherwise will get an MRI. Addendum: Discuss with sports medicine, they agree with referral

## 2015-05-15 NOTE — Patient Instructions (Signed)
Get your blood work before you leave    Two great books to learn about diabetes Diabetes for Dummies "The [facility] Diabetes diet" book

## 2015-05-15 NOTE — Assessment & Plan Note (Signed)
Recently diagnosed with diabetes based on a A1c 6.7. He already took some steps to improve his lifestyle. We had extensive discussion about diet, exercise, need to see the eye doctor. The concept of A1c was discussed with him. Recommend to see the eye doctor yearly. Plan: A1c, return to the office in 6 months. Pneumonia shot provided today

## 2015-05-19 ENCOUNTER — Encounter: Payer: Self-pay | Admitting: Family Medicine

## 2015-05-19 ENCOUNTER — Ambulatory Visit (INDEPENDENT_AMBULATORY_CARE_PROVIDER_SITE_OTHER): Payer: Commercial Managed Care - HMO | Admitting: Family Medicine

## 2015-05-19 VITALS — BP 125/86 | HR 68 | Ht 73.0 in | Wt 289.0 lb

## 2015-05-19 DIAGNOSIS — R2232 Localized swelling, mass and lump, left upper limb: Secondary | ICD-10-CM

## 2015-05-19 NOTE — Patient Instructions (Signed)
We will go ahead with an MRI with and without contrast of this mass to ensure it is benign. I will call you with the results the day following this and next steps. If this is benign I would recommend no further intervention. This does not have the usual appearance of a lipoma or a hematoma (collection of blood).

## 2015-05-20 DIAGNOSIS — C499 Malignant neoplasm of connective and soft tissue, unspecified: Secondary | ICD-10-CM | POA: Insufficient documentation

## 2015-05-20 HISTORY — DX: Malignant neoplasm of connective and soft tissue, unspecified: C49.9

## 2015-05-20 NOTE — Assessment & Plan Note (Signed)
Nontender and mobile, patient has not noticed increase in size since last year which are all good prognostic signs.  However, this does not have the typical appearance of a lipoma or a hematoma - appears solid with more vascularity than would expect.  Advised we go ahead with MRI with and without contrast to further evaluate.  I did not appreciate any lymphadenopathy along with this.

## 2015-05-20 NOTE — Addendum Note (Signed)
Addended by: Sherrie George F on: 05/20/2015 08:37 AM   Modules accepted: Orders

## 2015-05-20 NOTE — Progress Notes (Signed)
PCP and referred by: Kathlene November, MD  Subjective:   HPI: Patient is a 59 y.o. male here for left arm mass.  Patient comes in today for ultrasound evaluation of left upper arm mass. Patient recalls seeing a chiropractor last summer - this person used a roller on the tricep area and it was the first time he noticed this mass in his upper arm. No bruising following that - states it was present when they did the roller, not as a result of it. No pain here. Has not noticed a change in its size. Is right handed.  No past medical history on file.  Current Outpatient Prescriptions on File Prior to Visit  Medication Sig Dispense Refill  . Multiple Vitamin (MULTIVITAMIN WITH MINERALS) TABS tablet Take 1 tablet by mouth daily.     No current facility-administered medications on file prior to visit.    Past Surgical History  Procedure Laterality Date  . Inguinal hernia repair  2000    unilateral    No Known Allergies  Social History   Social History  . Marital Status: Married    Spouse Name: N/A  . Number of Children: 4  . Years of Education: N/A   Occupational History  . city of [place], [place]     Social History Main Topics  . Smoking status: Never Smoker   . Smokeless tobacco: Not on file  . Alcohol Use: 0.0 oz/week    0 Standard drinks or equivalent per week     Comment: rarely  . Drug Use: Not on file  . Sexual Activity: Not on file   Other Topics Concern  . Not on file   Social History Narrative   Occupation: works for the [employer] (not sedentary)    lost wife 06-2014 , breast cancer   2 children 41-15 y/o live w/ him          Family History  Problem Relation Age of Onset  . Hypertension Mother   . Colon cancer Father     <60  . Diabetes Mother   . Heart attack Neg Hx   . Prostate cancer Neg Hx     BP 125/86 mmHg  Pulse 68  Ht 6\' 1"  (1.854 m)  Wt 289 lb (131.09 kg)  BMI 38.14 kg/m2  Review of Systems: See HPI above.    Objective:  Physical  Exam:  Gen: NAD  Left arm: Mobile, nontender fairly large mass noted over left triceps area. Mass does not move with elbow flexion/extension. No visible pore overlying this area within the skin. No other deformity. FROM elbow and shoulder without pain. No axillary, supraclavicular, cervical lymphadenopathy.  MSK u/s left arm: 4.95 x 6.95cm mass visualized just superficial to triceps musculature.  Mass appears non-fatty with neovascularity.  No cystic components (all hypoechoic areas are vasculature)    Assessment & Plan:  1. Left arm mass - Nontender and mobile, patient has not noticed increase in size since last year which are all good prognostic signs.  However, this does not have the typical appearance of a lipoma or a hematoma - appears solid with more vascularity than would expect.  Advised we go ahead with MRI with and without contrast to further evaluate.  I did not appreciate any lymphadenopathy along with this.

## 2015-05-23 ENCOUNTER — Ambulatory Visit (HOSPITAL_BASED_OUTPATIENT_CLINIC_OR_DEPARTMENT_OTHER)
Admission: RE | Admit: 2015-05-23 | Discharge: 2015-05-23 | Disposition: A | Discharge status: Home / Self Care | Payer: Commercial Managed Care - HMO | Source: Ambulatory Visit | Attending: Family Medicine | Admitting: Family Medicine

## 2015-05-23 DIAGNOSIS — D4989 Neoplasm of unspecified behavior of other specified sites: Secondary | ICD-10-CM | POA: Insufficient documentation

## 2015-05-23 DIAGNOSIS — R2232 Localized swelling, mass and lump, left upper limb: Secondary | ICD-10-CM | POA: Diagnosis present

## 2015-05-23 MED ORDER — GADOBENATE DIMEGLUMINE 529 MG/ML IV SOLN: 20.0000 mL | Freq: Once | INTRAVENOUS | Status: DC | PRN

## 2015-05-25 ENCOUNTER — Telehealth: Payer: Self-pay | Admitting: Family Medicine

## 2015-05-25 NOTE — Telephone Encounter (Signed)
I called and spoke with patient regarding his MRI with and without contrast.  This confirms a solid mass, concerning for malignancy.  Ran the case by Dr. Marin Olp regarding next steps before speaking with Gary [name] - he suggested biopsy as the next step followed by consultation with him.  I gave patient the results and recommended biopsy - patient states he does not want to go ahead with any kind of intervention at this time.  I discussed the risks of this approach - spread of the tumor though lymphatic system, other organs, death.  He states he's aware of these risks but does not want to proceed.  He expressed concern regarding a biopsy spreading the tumor - reassured him regarding this and that the tissue would give [place] information regarding how quickly it could spread, treatment options, other imaging/labs that may need to be done.  He still does not want to proceed.  I advised him to think about this, call me should he change his mind.

## 2015-05-26 ENCOUNTER — Telehealth: Payer: Self-pay | Admitting: Internal Medicine

## 2015-05-26 DIAGNOSIS — R2232 Localized swelling, mass and lump, left upper limb: Secondary | ICD-10-CM

## 2015-05-26 NOTE — Telephone Encounter (Signed)
I spoke with Dr. Barbaraann Barthel, mass at the arm seems to be malignant based on the ultrasound and MRI.  he recommended a biopsy, patient strongly refused further eval. As his primary doctor I talked to him, explained is very likely that the mass is malignant and we need to continue the w/u before it spreads. He again expressed concern about a malignancy spreading once is "open" with a biopsy, states that is what happened to her wife his wife to have breast cancer. I counseled to the best of my opinion about his fears, be highly unlikely to spread his tumor without biopsy. After our conversation he eventually agreed to see a surgeon and talk about the procedure in detail. (I wonder if an option would be to remove the mass in its enterity w/o a FNA)

## 2015-06-17 ENCOUNTER — Ambulatory Visit: Payer: Self-pay | Admitting: Surgery

## 2015-06-17 NOTE — H&P (Signed)
Gary [name] 06/17/2015 11:18 AM Location: [facility] Patient #: [id number] DOB: [birth year] Married / Language: English / Race: Black or African American Male History of Present Illness Gary Moores A. [name] MD; 06/17/2015 12:46 PM) Patient words: left arm mass  Pt sent at the request od Dr Larose Kells for enlarging left arm mass over tricep region. It is not painful but enlarging. No redness or drainage. MR show a 6 cm fatty well circimscribed mass in the subcutaneous fat that in not involving the muscle. Radiologist gives many possibilities outlined in his report. It has been present for at least a year but has been slowly growing. Has seen a chiropractor in the past who last year rolled it.                      CLINICAL DATA: Soft tissue mass of the posterior lateral aspect of the left upper arm.  EXAM: MRI OF THE LEFT HUMERUS WITHOUT AND WITH CONTRAST  TECHNIQUE: Multiplanar, multisequence MR imaging was performed both before and after administration of intravenous contrast.  CONTRAST: 20 cc MultiHance  COMPARISON: None.  FINDINGS: There is an inhomogeneous solid irregularly marginated lobulated soft tissue mass in the subcutaneous fat of the lateral aspect of the left upper arm measuring 6.5 x 4.5 x 2.5 cm.  The mass is intermediate and density on T1 imaging slightly greater than muscle and is inhomogeneous high signal intensity on T2 and IR imaging and demonstrates diffuse enhancement after contrast administration.  There are some prominent vessels in the adjacent subcutaneous fat around the mass.  The mass does not invade the underlying muscle.  IMPRESSION: 6.5 cm neoplasm in the subcutaneous fat of the lateral aspect of the left upper arm superficial to the brachialis muscle. The appearance is worrisome for malignancy. The differential diagnosis includes Merkel cell carcinoma, metastasis, and liposarcoma. Lymphoma could also give this  appearance   Electronically Signed By: Gary [name] M.D. On: 05/24/2015 16:39.  The patient is a 59 year old male   Other Problems [name], CMA; 06/17/2015 11:18 AM) Gastroesophageal Reflux Disease Inguinal Hernia  Past Surgical History [name], CMA; 06/17/2015 11:18 AM) Open Inguinal Hernia Surgery Left.  Diagnostic Studies History [name], [place]; 06/17/2015 11:18 AM) Colonoscopy 5-10 years ago  Allergies [name], CMA; 06/17/2015 11:19 AM) No Known Drug Allergies 06/17/2015  Medication History [name], CMA; 06/17/2015 11:19 AM) Multiple Vitamin (Oral) Active. Medications Reconciled  Social History [name], [place]; 06/17/2015 11:18 AM) Alcohol use Moderate alcohol use. Illicit drug use Remotely quit drug use.  Family History [name], [place]; 06/17/2015 11:18 AM) Arthritis Mother. Colon Cancer Father. Hypertension Brother, Mother. Kidney Disease Brother.     Review of Systems [name] CMA; 06/17/2015 11:19 AM) General Not Present- Appetite Loss, Chills, Fatigue, Fever, Night Sweats, Weight Gain and Weight Loss. Skin Not Present- Change in Wart/Mole, Dryness, Hives, Jaundice, New Lesions, Non-Healing Wounds, Rash and Ulcer. HEENT Present- Hearing Loss and Wears glasses/contact lenses. Not Present- Earache, Hoarseness, Nose Bleed, Oral Ulcers, Ringing in the Ears, Seasonal Allergies, Sinus Pain, Sore Throat, Visual Disturbances and Yellow Eyes. Respiratory Present- Snoring. Not Present- Bloody sputum, Chronic Cough, Difficulty Breathing and Wheezing. Breast Not Present- Breast Mass, Breast Pain, Nipple Discharge and Skin Changes. Cardiovascular Not Present- Chest Pain, Difficulty Breathing Lying Down, Leg Cramps, Palpitations, Rapid Heart Rate, Shortness of Breath and Swelling of Extremities. Gastrointestinal Not Present- Abdominal Pain, Bloating, Bloody Stool, Change in Bowel Habits, Chronic diarrhea, Constipation, Difficulty Swallowing,  Excessive gas, Gets full quickly at meals, Hemorrhoids, Indigestion, Nausea, Rectal Pain and Vomiting. Male Genitourinary Present- Frequency. Not Present- Blood in Urine, Change in Urinary Stream, Impotence, Nocturia, Painful Urination, Urgency and Urine Leakage. Musculoskeletal Present- Joint Stiffness. Not Present- Back Pain, Joint Pain, Muscle Pain, Muscle Weakness and Swelling of Extremities. Neurological Not Present- Decreased Memory, Fainting, Headaches, Numbness, Seizures, Tingling, Tremor, Trouble walking and Weakness. Psychiatric Present- Change in Sleep Pattern. Not Present- Anxiety, Bipolar, Depression, Fearful and Frequent crying. Endocrine Not Present- Cold Intolerance, Excessive Hunger, Hair Changes, Heat Intolerance, Hot flashes and New Diabetes. Hematology Not Present- Easy Bruising, Excessive bleeding, Gland problems, HIV and Persistent Infections.  Vitals [name] CMA; 06/17/2015 11:20 AM) 06/17/2015 11:19 AM Weight: 293 lb Height: 73in Body Surface Area: 2.62 m Body Mass Index: 38.66 kg/m Temp.: 98.30F(Temporal)  Pulse: 80 (Regular)  BP: 130/76 (Sitting, Left Arm, Standard)     Physical Exam ([name] MD; 06/17/2015 12:47 PM)  General Mental Status-Alert. General Appearance-Consistent with stated age. Hydration-Well hydrated. Voice-Normal.  Head and Neck Head-normocephalic, atraumatic with no lesions or palpable masses. Trachea-midline. Thyroid Gland Characteristics - normal size and consistency.  Eye Eyeball - Bilateral-Extraocular movements intact. Sclera/Conjunctiva - Bilateral-No scleral icterus.  Chest and Lung Exam Chest and lung exam reveals -quiet, even and easy respiratory effort with no use of accessory muscles and on auscultation, normal breath sounds, no adventitious sounds and normal vocal resonance. Inspection Chest Wall - Normal. Back - normal.  Cardiovascular Cardiovascular examination reveals  -normal heart sounds, regular rate and rhythm with no murmurs and normal pedal pulses bilaterally.  Neurologic Neurologic evaluation reveals -alert and oriented x 3 with no impairment of recent or remote memory. Mental Status-Normal.  Musculoskeletal Note: 6 cm moblie mass overlying left triceps compartment not fixed and mobile n redness or drainage.     Assessment & Plan (Ruthene Methvin A. Kendra Grissett MD; 06/17/2015 12:50 PM)  MASS OF SOFT TISSUE OF LEFT UPPER EXTREMITY (R22.32) Impression: APPEARS FATTY: FAVOR to be LIPOMA BUT CANNOT EXCLUDE LIPOSARCOMA OR OTHER MAILIGNANCY  recommend resection of this mass pt unsure if he wishes to proceed. Risk of surgery include bleeding, infection, nerve injury leading to loss of function of extremity, numness weakness, more surgery or treatment. Needle biopsy not hepful with fatty tumors Observation the other option he will think this over and let me know.  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 Pt Education - [facility] Education - Written Instructions given: discussed with patient and provided information. Pt Education - [facility] Free Text Education/Instructions: discussed with patient and provided information.   The anatomy and the physiology was discussed. The pathophysiology and natural history of the disease was discussed. Options were discussed and recommendations were made. Technique, risks, benefits, & alternatives were discussed. Risks such as stroke, heart attack, bleeding, indection, death, and other risks discussed. Questions answered. The patient agrees to proceed. The pathophysiology of skin & subcutaneous masses was discussed. Natural history risks without surgery were discussed. I recommended surgery to remove the mass. I explained the technique of removal with use of local anesthesia & possible need for more aggressive sedation/anesthesia for  patient comfort.  Risks such as bleeding, infection, wound breakdown, heart attack, death, and other risks were discussed. I noted a good likelihood this will help address the problem. Possibility that this will not correct all symptoms was explained. Possibility of regrowth/recurrence of the mass was discussed. We  will work to minimize complications. Questions were answered. The patient expresses understanding & wishes to proceed with surgery.

## 2015-07-28 ENCOUNTER — Encounter (HOSPITAL_BASED_OUTPATIENT_CLINIC_OR_DEPARTMENT_OTHER): Payer: Self-pay | Admitting: *Deleted

## 2015-07-31 ENCOUNTER — Encounter (HOSPITAL_BASED_OUTPATIENT_CLINIC_OR_DEPARTMENT_OTHER)
Admission: RE | Admit: 2015-07-31 | Discharge: 2015-07-31 | Disposition: A | Discharge status: Home / Self Care | Payer: Commercial Managed Care - HMO | Source: Ambulatory Visit | Attending: Surgery | Admitting: Surgery

## 2015-07-31 DIAGNOSIS — D2362 Other benign neoplasm of skin of left upper limb, including shoulder: Secondary | ICD-10-CM | POA: Diagnosis not present

## 2015-07-31 DIAGNOSIS — D481 Neoplasm of uncertain behavior of connective and other soft tissue: Secondary | ICD-10-CM | POA: Diagnosis present

## 2015-07-31 DIAGNOSIS — Z6839 Body mass index (BMI) 39.0-39.9, adult: Secondary | ICD-10-CM | POA: Diagnosis not present

## 2015-07-31 LAB — CBC WITH DIFFERENTIAL/PLATELET
Basophils Absolute: 0 10*3/uL (ref 0.0–0.1)
Basophils Relative: 0 %
Eosinophils Absolute: 0.1 10*3/uL (ref 0.0–0.7)
Eosinophils Relative: 1 %
HEMATOCRIT: 42.9 % (ref 39.0–52.0)
HEMOGLOBIN: 13.7 g/dL (ref 13.0–17.0)
LYMPHS ABS: 1.7 10*3/uL (ref 0.7–4.0)
LYMPHS PCT: 35 %
MCH: 26.4 pg (ref 26.0–34.0)
MCHC: 31.9 g/dL (ref 30.0–36.0)
MCV: 82.7 fL (ref 78.0–100.0)
MONO ABS: 0.4 10*3/uL (ref 0.1–1.0)
MONOS PCT: 8 %
NEUTROS ABS: 2.7 10*3/uL (ref 1.7–7.7)
Neutrophils Relative %: 56 %
Platelets: 246 10*3/uL (ref 150–400)
RBC: 5.19 MIL/uL (ref 4.22–5.81)
RDW: 14.6 % (ref 11.5–15.5)
WBC: 5 10*3/uL (ref 4.0–10.5)

## 2015-07-31 LAB — COMPREHENSIVE METABOLIC PANEL
ALK PHOS: 48 U/L (ref 38–126)
ALT: 24 U/L (ref 17–63)
ANION GAP: 7 (ref 5–15)
AST: 18 U/L (ref 15–41)
Albumin: 3.5 g/dL (ref 3.5–5.0)
BILIRUBIN TOTAL: 0.8 mg/dL (ref 0.3–1.2)
BUN: 9 mg/dL (ref 6–20)
CALCIUM: 8.7 mg/dL — AB (ref 8.9–10.3)
CO2: 26 mmol/L (ref 22–32)
Chloride: 104 mmol/L (ref 101–111)
Creatinine, Ser: 1.15 mg/dL (ref 0.61–1.24)
GFR calc Af Amer: >60 mL/min (ref >60)
Glucose, Bld: 102 mg/dL — ABNORMAL HIGH (ref 65–99)
POTASSIUM: 4.7 mmol/L (ref 3.5–5.1)
Sodium: 137 mmol/L (ref 135–145)
TOTAL PROTEIN: 6.7 g/dL (ref 6.5–8.1)

## 2015-08-04 ENCOUNTER — Ambulatory Visit (HOSPITAL_BASED_OUTPATIENT_CLINIC_OR_DEPARTMENT_OTHER): Payer: Commercial Managed Care - HMO | Admitting: Anesthesiology

## 2015-08-04 ENCOUNTER — Ambulatory Visit (HOSPITAL_BASED_OUTPATIENT_CLINIC_OR_DEPARTMENT_OTHER)
Admission: RE | Admit: 2015-08-04 | Discharge: 2015-08-04 | Disposition: A | Discharge status: Home / Self Care | Payer: Commercial Managed Care - HMO | Source: Ambulatory Visit | Attending: Surgery | Admitting: Surgery

## 2015-08-04 ENCOUNTER — Encounter (HOSPITAL_BASED_OUTPATIENT_CLINIC_OR_DEPARTMENT_OTHER)
Admission: RE | Disposition: A | Discharge status: Home / Self Care | Payer: Self-pay | Source: Ambulatory Visit | Attending: Surgery

## 2015-08-04 ENCOUNTER — Encounter (HOSPITAL_BASED_OUTPATIENT_CLINIC_OR_DEPARTMENT_OTHER): Payer: Self-pay | Admitting: *Deleted

## 2015-08-04 DIAGNOSIS — D2362 Other benign neoplasm of skin of left upper limb, including shoulder: Secondary | ICD-10-CM | POA: Insufficient documentation

## 2015-08-04 DIAGNOSIS — Z6839 Body mass index (BMI) 39.0-39.9, adult: Secondary | ICD-10-CM | POA: Insufficient documentation

## 2015-08-04 HISTORY — PX: MASS EXCISION: SHX2000

## 2015-08-04 LAB — GLUCOSE, CAPILLARY: GLUCOSE-CAPILLARY: 117 mg/dL — AB (ref 65–99)

## 2015-08-04 SURGERY — EXCISION MASS
Anesthesia: General | Site: Arm Upper | Laterality: Left

## 2015-08-04 MED ORDER — CEFAZOLIN SODIUM-DEXTROSE 2-3 GM-% IV SOLR
INTRAVENOUS | Status: AC
  Filled 2015-08-04: qty 100

## 2015-08-04 MED ORDER — CHLORHEXIDINE GLUCONATE 4 % EX LIQD: 1.0000 "application " | Freq: Once | CUTANEOUS | Status: DC

## 2015-08-04 MED ORDER — LIDOCAINE HCL (CARDIAC) 20 MG/ML IV SOLN
INTRAVENOUS | Status: AC
  Filled 2015-08-04: qty 5

## 2015-08-04 MED ORDER — DEXTROSE 5 % IV SOLN
3.0000 g | INTRAVENOUS | Status: AC
  Administered 2015-08-04: 3 g via INTRAVENOUS

## 2015-08-04 MED ORDER — OXYCODONE-ACETAMINOPHEN 5-325 MG PO TABS: 1.0000 | ORAL_TABLET | ORAL | Status: DC | PRN

## 2015-08-04 MED ORDER — MIDAZOLAM HCL 2 MG/2ML IJ SOLN
INTRAMUSCULAR | Status: AC
  Filled 2015-08-04: qty 4

## 2015-08-04 MED ORDER — ONDANSETRON HCL 4 MG/2ML IJ SOLN
INTRAMUSCULAR | Status: AC
  Filled 2015-08-04: qty 2

## 2015-08-04 MED ORDER — DEXAMETHASONE SODIUM PHOSPHATE 10 MG/ML IJ SOLN
INTRAMUSCULAR | Status: AC
  Filled 2015-08-04: qty 1

## 2015-08-04 MED ORDER — PROPOFOL 10 MG/ML IV BOLUS
INTRAVENOUS | Status: DC | PRN
  Administered 2015-08-04 (×3): 50 mg via INTRAVENOUS
  Administered 2015-08-04: 300 mg via INTRAVENOUS
  Administered 2015-08-04: 50 mg via INTRAVENOUS

## 2015-08-04 MED ORDER — PROPOFOL 10 MG/ML IV BOLUS
INTRAVENOUS | Status: AC
  Filled 2015-08-04: qty 20

## 2015-08-04 MED ORDER — MIDAZOLAM HCL 2 MG/2ML IJ SOLN: 1.0000 mg | INTRAMUSCULAR | Status: DC | PRN

## 2015-08-04 MED ORDER — LACTATED RINGERS IV SOLN
INTRAVENOUS | Status: DC
  Administered 2015-08-04 (×2): via INTRAVENOUS

## 2015-08-04 MED ORDER — FENTANYL CITRATE (PF) 100 MCG/2ML IJ SOLN: 25.0000 ug | INTRAMUSCULAR | Status: DC | PRN

## 2015-08-04 MED ORDER — EPHEDRINE SULFATE 50 MG/ML IJ SOLN
INTRAMUSCULAR | Status: DC | PRN
  Administered 2015-08-04: 10 mg via INTRAVENOUS

## 2015-08-04 MED ORDER — LIDOCAINE HCL (CARDIAC) 20 MG/ML IV SOLN
INTRAVENOUS | Status: DC | PRN
  Administered 2015-08-04: 50 mg via INTRAVENOUS

## 2015-08-04 MED ORDER — DEXAMETHASONE SODIUM PHOSPHATE 4 MG/ML IJ SOLN
INTRAMUSCULAR | Status: DC | PRN
  Administered 2015-08-04: 10 mg via INTRAVENOUS

## 2015-08-04 MED ORDER — SCOPOLAMINE 1 MG/3DAYS TD PT72: 1.0000 | MEDICATED_PATCH | Freq: Once | TRANSDERMAL | Status: DC | PRN

## 2015-08-04 MED ORDER — KETOROLAC TROMETHAMINE 30 MG/ML IJ SOLN: 30.0000 mg | Freq: Once | INTRAMUSCULAR | Status: DC

## 2015-08-04 MED ORDER — FENTANYL CITRATE (PF) 100 MCG/2ML IJ SOLN
INTRAMUSCULAR | Status: AC
  Filled 2015-08-04: qty 4

## 2015-08-04 MED ORDER — BUPIVACAINE-EPINEPHRINE 0.25% -1:200000 IJ SOLN
INTRAMUSCULAR | Status: DC | PRN
  Administered 2015-08-04: 30 mL

## 2015-08-04 MED ORDER — GLYCOPYRROLATE 0.2 MG/ML IJ SOLN: 0.2000 mg | Freq: Once | INTRAMUSCULAR | Status: DC | PRN

## 2015-08-04 MED ORDER — PROMETHAZINE HCL 25 MG/ML IJ SOLN: 6.2500 mg | INTRAMUSCULAR | Status: DC | PRN

## 2015-08-04 MED ORDER — FENTANYL CITRATE (PF) 100 MCG/2ML IJ SOLN: 50.0000 ug | INTRAMUSCULAR | Status: DC | PRN

## 2015-08-04 MED ORDER — FENTANYL CITRATE (PF) 100 MCG/2ML IJ SOLN
INTRAMUSCULAR | Status: DC | PRN
  Administered 2015-08-04: 100 ug via INTRAVENOUS

## 2015-08-04 MED ORDER — ONDANSETRON HCL 4 MG/2ML IJ SOLN
INTRAMUSCULAR | Status: DC | PRN
  Administered 2015-08-04: 4 mg via INTRAVENOUS

## 2015-08-04 MED ORDER — MIDAZOLAM HCL 5 MG/5ML IJ SOLN
INTRAMUSCULAR | Status: DC | PRN
  Administered 2015-08-04: 2 mg via INTRAVENOUS

## 2015-08-04 MED ORDER — PROMETHAZINE HCL 25 MG/ML IJ SOLN
6.2500 mg | INTRAMUSCULAR | Status: DC | PRN
Start: 2015-08-04 — End: 2015-08-04

## 2015-08-04 SURGICAL SUPPLY — 33 items
BANDAGE ELASTIC 4 VELCRO ST LF (GAUZE/BANDAGES/DRESSINGS) ×2 IMPLANT
BLADE SURG 15 STRL LF DISP TIS (BLADE) ×1 IMPLANT
BLADE SURG 15 STRL SS (BLADE) ×3
CHLORAPREP W/TINT 26ML (MISCELLANEOUS) ×3 IMPLANT
COVER BACK TABLE 60X90IN (DRAPES) ×3 IMPLANT
COVER MAYO STAND STRL (DRAPES) ×3 IMPLANT
DRAPE LAPAROTOMY 100X72 PEDS (DRAPES) ×3 IMPLANT
DRAPE UTILITY XL STRL (DRAPES) ×3 IMPLANT
ELECT COATED BLADE 2.86 ST (ELECTRODE) ×3 IMPLANT
ELECT REM PT RETURN 9FT ADLT (ELECTROSURGICAL) ×3
ELECTRODE REM PT RTRN 9FT ADLT (ELECTROSURGICAL) ×1 IMPLANT
GLOVE BIOGEL PI IND STRL 7.0 (GLOVE) ×1 IMPLANT
GLOVE BIOGEL PI IND STRL 8 (GLOVE) ×1 IMPLANT
GLOVE BIOGEL PI INDICATOR 7.0 (GLOVE) ×2
GLOVE BIOGEL PI INDICATOR 8 (GLOVE) ×2
GLOVE ECLIPSE 6.5 STRL STRAW (GLOVE) ×3 IMPLANT
GLOVE ECLIPSE 8.0 STRL XLNG CF (GLOVE) ×3 IMPLANT
GLOVE EXAM NITRILE MD LF STRL (GLOVE) ×3 IMPLANT
GOWN STRL REUS W/ TWL LRG LVL3 (GOWN DISPOSABLE) ×2 IMPLANT
GOWN STRL REUS W/TWL LRG LVL3 (GOWN DISPOSABLE) ×6
KIT MARKER MARGIN INK (KITS) ×3 IMPLANT
LIQUID BAND (GAUZE/BANDAGES/DRESSINGS) ×2 IMPLANT
NEEDLE HYPO 25X1 1.5 SAFETY (NEEDLE) ×3 IMPLANT
NS IRRIG 1000ML POUR BTL (IV SOLUTION) ×3 IMPLANT
PACK BASIN DAY SURGERY FS (CUSTOM PROCEDURE TRAY) ×3 IMPLANT
PENCIL BUTTON HOLSTER BLD 10FT (ELECTRODE) ×3 IMPLANT
SLEEVE SCD COMPRESS KNEE MED (MISCELLANEOUS) ×3 IMPLANT
SPONGE LAP 4X18 X RAY DECT (DISPOSABLE) ×4 IMPLANT
SUT MON AB 4-0 PC3 18 (SUTURE) ×3 IMPLANT
SUT VICRYL 3-0 CR8 SH (SUTURE) ×3 IMPLANT
SYR CONTROL 10ML LL (SYRINGE) ×3 IMPLANT
TOWEL OR 17X24 6PK STRL BLUE (TOWEL DISPOSABLE) ×4 IMPLANT
TOWEL OR NON WOVEN STRL DISP B (DISPOSABLE) ×3 IMPLANT

## 2015-08-04 NOTE — Anesthesia Postprocedure Evaluation (Signed)
  Anesthesia Post-op Note  Patient: Gary [name]  Procedure(s) Performed: Procedure(s) (LRB): EXCISION OF MASS ON LEFT ARM (Left)  Patient Location: PACU  Anesthesia Type: General  Level of Consciousness: awake and alert   Airway and Oxygen Therapy: Patient Spontanous Breathing  Post-op Pain: mild  Post-op Assessment: Post-op Vital signs reviewed, Patient's Cardiovascular Status Stable, Respiratory Function Stable, Patent Airway and No signs of Nausea or vomiting  Last Vitals:  Filed Vitals:   08/04/15 0837  BP: 150/84  Pulse: 86  Temp: 36.4 C  Resp: 26    Post-op Vital Signs: stable   Complications: No apparent anesthesia complications

## 2015-08-04 NOTE — Anesthesia Preprocedure Evaluation (Signed)
Anesthesia Evaluation  Patient identified by MRN, date of birth, ID band Patient awake    Reviewed: Allergy & Precautions, NPO status , Patient's Chart, lab work & pertinent test results  Airway Mallampati: II  TM Distance: <3 FB Neck ROM: Full    Dental no notable dental hx.    Pulmonary neg pulmonary ROS,    Pulmonary exam normal breath sounds clear to auscultation       Cardiovascular negative cardio ROS Normal cardiovascular exam Rhythm:Regular Rate:Normal     Neuro/Psych negative neurological ROS  negative psych ROS   GI/Hepatic negative GI ROS, Neg liver ROS,   Endo/Other  Morbid obesity  Renal/GU negative Renal ROS  negative genitourinary   Musculoskeletal negative musculoskeletal ROS (+)   Abdominal (+) + obese,   Peds negative pediatric ROS (+)  Hematology negative hematology ROS (+)   Anesthesia Other Findings   Reproductive/Obstetrics negative OB ROS                             Anesthesia Physical Anesthesia Plan  ASA: II  Anesthesia Plan: General   Post-op Pain Management:    Induction: Intravenous  Airway Management Planned: LMA  Additional Equipment:   Intra-op Plan:   Post-operative Plan:   Informed Consent: I have reviewed the patients History and Physical, chart, labs and discussed the procedure including the risks, benefits and alternatives for the proposed anesthesia with the patient or authorized representative who has indicated his/her understanding and acceptance.   Dental advisory given  Plan Discussed with: CRNA and Surgeon  Anesthesia Plan Comments:         Anesthesia Quick Evaluation

## 2015-08-04 NOTE — Discharge Instructions (Signed)
GENERAL SURGERY: POST OP INSTRUCTIONS ° °1. DIET: Follow a light bland diet the first 24 hours after arrival home, such as soup, liquids, crackers, etc.  Be sure to include lots of fluids daily.  Avoid fast food or heavy meals as your are more likely to get nauseated.   °2. Take your usually prescribed home medications unless otherwise directed. °3. PAIN CONTROL: °a. Pain is best controlled by a usual combination of three different methods TOGETHER: °i. Ice/Heat °ii. Over the counter pain medication °iii. Prescription pain medication °b. Most patients will experience some swelling and bruising around the incisions.  Ice packs or heating pads (30-60 minutes up to 6 times a day) will help. Use ice for the first few days to help decrease swelling and bruising, then switch to heat to help relax tight/sore spots and speed recovery.  Some people prefer to use ice alone, heat alone, alternating between ice & heat.  Experiment to what works for you.  Swelling and bruising can take several weeks to resolve.   °c. It is helpful to take an over-the-counter pain medication regularly for the first few weeks.  Choose one of the following that works best for you: °i. Naproxen (Aleve, etc)  Two 220mg tabs twice a day °ii. Ibuprofen (Advil, etc) Three 200mg tabs four times a day (every meal & bedtime) °iii. Acetaminophen (Tylenol, etc) 500-650mg four times a day (every meal & bedtime) °d. A  prescription for pain medication (such as oxycodone, hydrocodone, etc) should be given to you upon discharge.  Take your pain medication as prescribed.  °i. If you are having problems/concerns with the prescription medicine (does not control pain, nausea, vomiting, rash, itching, etc), please call us [phone] to see if we need to switch you to a different pain medicine that will work better for you and/or control your side effect better. °ii. If you need a refill on your pain medication, please contact your pharmacy.  They will contact our  office to request authorization. Prescriptions will not be filled after 5 pm or on week-ends. °4. Avoid getting constipated.  Between the surgery and the pain medications, it is common to experience some constipation.  Increasing fluid intake and taking a fiber supplement (such as Metamucil, Citrucel, FiberCon, MiraLax, etc) 1-2 times a day regularly will usually help prevent this problem from occurring.  A mild laxative (prune juice, Milk of Magnesia, MiraLax, etc) should be taken according to package directions if there are no bowel movements after 48 hours.   °5. Wash / shower every day.  You may shower over the dressings as they are waterproof.  Continue to shower over incision(s) after the dressing is off. °6. Remove your waterproof bandages 5 days after surgery.  You may leave the incision open to air.  You may have skin tapes (Steri Strips) covering the incision(s).  Leave them on until one week, then remove.  You may replace a dressing/Band-Aid to cover the incision for comfort if you wish.  ° ° ° ° °7. ACTIVITIES as tolerated:   °a. You may resume regular (light) daily activities beginning the next day--such as daily self-care, walking, climbing stairs--gradually increasing activities as tolerated.  If you can walk 30 minutes without difficulty, it is safe to try more intense activity such as jogging, treadmill, bicycling, low-impact aerobics, swimming, etc. °b. Save the most intensive and strenuous activity for last such as sit-ups, heavy lifting, contact sports, etc  Refrain from any heavy lifting or straining until you   are off narcotics for pain control.   °c. DO NOT PUSH THROUGH PAIN.  Let pain be your guide: If it hurts to do something, don't do it.  Pain is your body warning you to avoid that activity for another week until the pain goes down. °d. You may drive when you are no longer taking prescription pain medication, you can comfortably wear a seatbelt, and you can safely maneuver your car and  apply brakes. °e. You may have sexual intercourse when it is comfortable.  °8. FOLLOW UP in our office °a. Please call [facility] at [phone] to set up an appointment to see your surgeon in the office for a follow-up appointment approximately 2-3 weeks after your surgery. °b. Make sure that you call for this appointment the day you arrive home to insure a convenient appointment time. °9. IF YOU HAVE DISABILITY OR FAMILY LEAVE FORMS, BRING THEM TO THE OFFICE FOR PROCESSING.  DO NOT GIVE THEM TO YOUR DOCTOR. ° ° °WHEN TO CALL US [phone]: °1. Poor pain control °2. Reactions / problems with new medications (rash/itching, nausea, etc)  °3. Fever over 101.5 F (38.5 C) °4. Worsening swelling or bruising °5. Continued bleeding from incision. °6. Increased pain, redness, or drainage from the incision °7. Difficulty breathing / swallowing ° ° The clinic staff is available to answer your questions during regular business hours (8:30am-5pm).  Please don’t hesitate to call and ask to speak to one of our nurses for clinical concerns.  ° If you have a medical emergency, go to the nearest emergency room or call 911. ° A surgeon from [facility] is always on call at the hospitals ° ° °[facility] °[address], [address], , [place]  [address] ? °MAIN: [phone] ? TOLL FREE: [phone] ?  °FAX [phone] °[email or web address] ° °Post Anesthesia Home Care Instructions ° °Activity: °Get plenty of rest for the remainder of the day. A responsible adult should stay with you for 24 hours following the procedure.  °For the next 24 hours, DO NOT: °-Drive a car °-Operate machinery °-Drink alcoholic beverages °-Take any medication unless instructed by your physician °-Make any legal decisions or sign important papers. ° °Meals: °Start with liquid foods such as gelatin or soup. Progress to regular foods as tolerated. Avoid greasy, spicy, heavy foods. If nausea and/or  vomiting occur, drink only clear liquids until the nausea and/or vomiting subsides. Call your physician if vomiting continues. ° °Special Instructions/Symptoms: °Your throat may feel dry or sore from the anesthesia or the breathing tube placed in your throat during surgery. If this causes discomfort, gargle with warm salt water. The discomfort should disappear within 24 hours. ° °If you had a scopolamine patch placed behind your ear for the management of post- operative nausea and/or vomiting: ° °1. The medication in the patch is effective for 72 hours, after which it should be removed.  Wrap patch in a tissue and discard in the trash. Wash hands thoroughly with soap and water. °2. You may remove the patch earlier than 72 hours if you experience unpleasant side effects which may include dry mouth, dizziness or visual disturbances. °3. Avoid touching the patch. Wash your hands with soap and water after contact with the patch. °  ° °

## 2015-08-04 NOTE — H&P (Signed)
H&P   Gary [name] (MR# [id number])      H&P Info    Author Note Status Last Update User Last Update Date/Time   Gary Luna, MD Signed Gary Luna, MD 06/17/2015 12:50 PM    H&P    Expand All Collapse All   Gary [name] 06/17/2015 11:18 AM Location: [facility] Patient #: [id number] DOB: 08/30/1956 Married / Language: English / Race: Black or African American Male History of Present Illness Gary [name] A. Zaevion Parke MD; 06/17/2015 12:46 PM) Patient words: left arm mass  Pt sent at the request od Dr Larose Kells for enlarging left arm mass over tricep region. It is not painful but enlarging. No redness or drainage. MR show a 6 cm fatty well circimscribed mass in the subcutaneous fat that in not involving the muscle. Radiologist gives many possibilities outlined in his report. It has been present for at least a year but has been slowly growing. Has seen a chiropractor in the past who last year rolled it.                      CLINICAL DATA: Soft tissue mass of the posterior lateral aspect of the left upper arm.  EXAM: MRI OF THE LEFT HUMERUS WITHOUT AND WITH CONTRAST  TECHNIQUE: Multiplanar, multisequence MR imaging was performed both before and after administration of intravenous contrast.  CONTRAST: 20 cc MultiHance  COMPARISON: None.  FINDINGS: There is an inhomogeneous solid irregularly marginated lobulated soft tissue mass in the subcutaneous fat of the lateral aspect of the left upper arm measuring 6.5 x 4.5 x 2.5 cm.  The mass is intermediate and density on T1 imaging slightly greater than muscle and is inhomogeneous high signal intensity on T2 and IR imaging and demonstrates diffuse enhancement after contrast administration.  There are some prominent vessels in the adjacent subcutaneous fat around the mass.  The mass does not invade the underlying muscle.  IMPRESSION: 6.5 cm neoplasm in the subcutaneous fat of the lateral aspect of  the left upper arm superficial to the brachialis muscle. The appearance is worrisome for malignancy. The differential diagnosis includes Merkel cell carcinoma, metastasis, and liposarcoma. Lymphoma could also give this appearance   Electronically Signed By: Gary [name] M.D. On: 05/24/2015 16:39.  The patient is a 59 year old male   Other Problems Gary [name], CMA; 06/17/2015 11:18 AM) Gastroesophageal Reflux Disease Inguinal Hernia  Past Surgical History Gary [name], CMA; 06/17/2015 11:18 AM) Open Inguinal Hernia Surgery Left.  Diagnostic Studies History Gary [name], [place]; 06/17/2015 11:18 AM) Colonoscopy 5-10 years ago  Allergies Gary [name], CMA; 06/17/2015 11:19 AM) No Known Drug Allergies 06/17/2015  Medication History Gary [name], CMA; 06/17/2015 11:19 AM) Multiple Vitamin (Oral) Active. Medications Reconciled  Social History Gary [name], [place]; 06/17/2015 11:18 AM) Alcohol use Moderate alcohol use. Illicit drug use Remotely quit drug use.  Family History Gary [name], [place]; 06/17/2015 11:18 AM) Arthritis Mother. Colon Cancer Father. Hypertension Brother, Mother. Kidney Disease Brother.     Review of Systems Gary [name] CMA; 06/17/2015 11:19 AM) General Not Present- Appetite Loss, Chills, Fatigue, Fever, Night Sweats, Weight Gain and Weight Loss. Skin Not Present- Change in Wart/Mole, Dryness, Hives, Jaundice, New Lesions, Non-Healing Wounds, Rash and Ulcer. HEENT Present- Hearing Loss and Wears glasses/contact lenses. Not Present- Earache, Hoarseness, Nose Bleed, Oral Ulcers, Ringing in the Ears, Seasonal Allergies, Sinus Pain, Sore Throat, Visual Disturbances and Yellow Eyes. Respiratory Present- Snoring. Not Present- Bloody sputum, Chronic Cough, Difficulty Breathing and Wheezing.  Breast Not Present- Breast Mass, Breast Pain, Nipple Discharge and Skin Changes. Cardiovascular Not Present- Chest Pain, Difficulty Breathing Lying Down, Leg Cramps,  Palpitations, Rapid Heart Rate, Shortness of Breath and Swelling of Extremities. Gastrointestinal Not Present- Abdominal Pain, Bloating, Bloody Stool, Change in Bowel Habits, Chronic diarrhea, Constipation, Difficulty Swallowing, Excessive gas, Gets full quickly at meals, Hemorrhoids, Indigestion, Nausea, Rectal Pain and Vomiting. Male Genitourinary Present- Frequency. Not Present- Blood in Urine, Change in Urinary Stream, Impotence, Nocturia, Painful Urination, Urgency and Urine Leakage. Musculoskeletal Present- Joint Stiffness. Not Present- Back Pain, Joint Pain, Muscle Pain, Muscle Weakness and Swelling of Extremities. Neurological Not Present- Decreased Memory, Fainting, Headaches, Numbness, Seizures, Tingling, Tremor, Trouble walking and Weakness. Psychiatric Present- Change in Sleep Pattern. Not Present- Anxiety, Bipolar, Depression, Fearful and Frequent crying. Endocrine Not Present- Cold Intolerance, Excessive Hunger, Hair Changes, Heat Intolerance, Hot flashes and New Diabetes. Hematology Not Present- Easy Bruising, Excessive bleeding, Gland problems, HIV and Persistent Infections.  Vitals Gary [name] CMA; 06/17/2015 11:20 AM) 06/17/2015 11:19 AM Weight: 293 lb Height: 73in Body Surface Area: 2.62 m Body Mass Index: 38.66 kg/m Temp.: 98.32F(Temporal)  Pulse: 80 (Regular)  BP: 130/76 (Sitting, Left Arm, Standard)     Physical Exam (Gary [name] A. Edrei Norgaard MD; 06/17/2015 12:47 PM)  General Mental Status-Alert. General Appearance-Consistent with stated age. Hydration-Well hydrated. Voice-Normal.  Head and Neck Head-normocephalic, atraumatic with no lesions or palpable masses. Trachea-midline. Thyroid Gland Characteristics - normal size and consistency.  Eye Eyeball - Bilateral-Extraocular movements intact. Sclera/Conjunctiva - Bilateral-No scleral icterus.  Chest and Lung Exam Chest and lung exam reveals -quiet, even and easy respiratory effort with  no use of accessory muscles and on auscultation, normal breath sounds, no adventitious sounds and normal vocal resonance. Inspection Chest Wall - Normal. Back - normal.  Cardiovascular Cardiovascular examination reveals -normal heart sounds, regular rate and rhythm with no murmurs and normal pedal pulses bilaterally.  Neurologic Neurologic evaluation reveals -alert and oriented x 3 with no impairment of recent or remote memory. Mental Status-Normal.  Musculoskeletal Note: 6 cm moblie mass overlying left triceps compartment not fixed and mobile n redness or drainage.     Assessment & Plan (Keymoni Mccaster A. Scarleth Brame MD; 06/17/2015 12:50 PM)  MASS OF SOFT TISSUE OF LEFT UPPER EXTREMITY (R22.32) Impression: APPEARS FATTY: FAVOR to be LIPOMA BUT CANNOT EXCLUDE LIPOSARCOMA OR OTHER MAILIGNANCY  recommend resection of this mass pt unsure if he wishes to proceed. Risk of surgery include bleeding, infection, nerve injury leading to loss of function of extremity, numness weakness, more surgery or treatment. Needle biopsy not hepful with fatty tumors Observation the other option he will think this over and let me know.  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 Pt Education - [facility] Education - Written Instructions given: discussed with patient and provided information. Pt Education - [facility] Free Text Education/Instructions: discussed with patient and provided information.   The anatomy and the physiology was discussed. The pathophysiology and natural history of the disease was discussed. Options were discussed and recommendations were made. Technique, risks, benefits, & alternatives were discussed. Risks such as stroke, heart attack, bleeding, indection, death, and other risks discussed. Questions answered. The patient agrees to proceed. The pathophysiology of skin & subcutaneous masses was  discussed. Natural history risks without surgery were discussed. I recommended surgery to remove the mass. I explained the technique of removal with use of local anesthesia & possible need for  more aggressive sedation/anesthesia for patient comfort.  Risks such as bleeding, infection, wound breakdown, heart attack, death, and other risks were discussed. I noted a good likelihood this will help address the problem. Possibility that this will not correct all symptoms was explained. Possibility of regrowth/recurrence of the mass was discussed. We will work to minimize complications. Questions were answered. The patient expresses understanding & wishes to proceed with surgery.

## 2015-08-04 NOTE — Op Note (Signed)
Preoperative diagnosis: 6 cm left arm mass subcutaneous overlying left triceps muscle  Postoperative diagnosis: Same  Procedure: Excision of left upper arm mass 6 cm subcutaneous  Surgeon: [name] M.D.  Anesthesia: LMA with 0.25% Sensorcaine local with epinephrine  Specimen: 6 cm well-demarcated lobulated mass to pathology oriented within  Drains: None  EBL: Minimal  Indications for procedure: The patient presents with a left arm mass overlying his left triceps muscle. It's been there for a number of months. About 3 months ago he underwent massage therapy and this got much larger after. Denies any pain. Denies any drainage or change of the overlying skin. Denies any weakness of his left arm or left him. Denies any numbness. MRI was obtained by his primary care provider which showed a 6 cm well-demarcated mass subcutaneous fat separate from the underlying musculature. He desired excision.The procedure has been discussed with the patient.  Alternative therapies have been discussed with the patient.  Operative risks include bleeding,  Infection,  Organ injury,  Nerve injury,  Blood vessel injury,  DVT,  Pulmonary embolism,  Death,  And possible reoperation.  Medical management risks include worsening of present situation.  The success of the procedure is 50 -90 % at treating patients symptoms.  The patient understands and agrees to proceed.  Description of procedure: The patient was met in the holding area and questions were answered. The left arm mass was marked with the assistance of the patient over the posterior aspect of his left upper arm overlying the triceps muscle. She's taken back to the operating room and placed supine on the OR table LMA anesthesia was initiated. The left arm was then pulled up over his torso and taped exposing the mass.. Was prepped and draped in a sterile fashion and timeout was done to verify proper patient and procedure and site. 0.25% Sensorcaine was used for  local anesthesia this was infiltrated over the skin. A 6 cm transverse incision was made and dissection was carried down to the mass. This was not a typical lipoma therefore I excised the entire mass with a rim of fatty tissue rounded down to the triceps muscle this was separate from this and not invading it. Hemostasis was achieved with cautery. The mass was removed in its entirety. It was oriented and sent to pathology. The operative field was examined. There is no violation of the triceps muscle. I did take the fascia of the triceps muscle where the mass was abutting this. No evidence of invasion. The wound was made hemostatic with cautery and then closed in 2 layers with 3-0 Vicryl and 4-0 Monocryl. Liquid adhesive was applied as dressing. Ace wrap placed for compression. All final counts sponge, needle and management found to be correct this portion case. Patient was awoke extubated taken recovery in satisfactory condition.

## 2015-08-04 NOTE — Transfer of Care (Signed)
Immediate Anesthesia Transfer of Care Note  Patient: Gary [name]  Procedure(s) Performed: Procedure(s): EXCISION OF MASS ON LEFT ARM (Left)  Patient Location: PACU  Anesthesia Type:General  Level of Consciousness: awake and patient cooperative  Airway & Oxygen Therapy: Patient Spontanous Breathing and Patient connected to face mask oxygen  Post-op Assessment: Report given to RN and Post -op Vital signs reviewed and stable  Post vital signs: Reviewed and stable  Last Vitals:  Filed Vitals:   08/04/15 0837  BP:   Pulse: 86  Temp:   Resp: 26    Complications: No apparent anesthesia complications

## 2015-08-04 NOTE — Anesthesia Procedure Notes (Signed)
Procedure Name: LMA Insertion Date/Time: 08/04/2015 7:33 AM Performed by: Toula Moos L Pre-anesthesia Checklist: Patient identified, Emergency Drugs available, Suction available, Patient being monitored and Timeout performed Patient Re-evaluated:Patient Re-evaluated prior to inductionOxygen Delivery Method: Circle System Utilized Preoxygenation: Pre-oxygenation with 100% oxygen Intubation Type: IV induction Ventilation: Mask ventilation without difficulty LMA: LMA inserted LMA Size: 5.0 Number of attempts: 1 Airway Equipment and Method: Bite block Placement Confirmation: positive ETCO2 Tube secured with: Tape Dental Injury: Teeth and Oropharynx as per pre-operative assessment

## 2015-08-04 NOTE — Interval H&P Note (Signed)
History and Physical Interval Note:  08/04/2015 7:17 AM  Gary [name]  has presented today for surgery, with the diagnosis of Mass on Left Arm  The various methods of treatment have been discussed with the patient and family. After consideration of risks, benefits and other options for treatment, the patient has consented to  Procedure(s): EXCISION OF MASS ON LEFT ARM (Left) as a surgical intervention .  The patient's history has been reviewed, patient examined, no change in status, stable for surgery.  I have reviewed the patient's chart and labs.  Questions were answered to the patient's satisfaction.     [name] A.

## 2015-08-05 ENCOUNTER — Encounter (HOSPITAL_BASED_OUTPATIENT_CLINIC_OR_DEPARTMENT_OTHER): Payer: Self-pay | Admitting: Surgery

## 2015-08-13 ENCOUNTER — Encounter (HOSPITAL_COMMUNITY): Payer: Self-pay

## 2015-08-17 NOTE — Progress Notes (Signed)
   Presentation:  Mr. Gary [name] was sent at the request od Dr Larose Kells for enlarging left arm mass over tricep region. It is not painful but enlarging. No redness or drainage. MR show a 6 cm fatty well circimscribed mass in the subcutaneous fat that in not involving the muscle.  Patient reports the mass showed up after he had an injection in his left arm in April.  Surgeon: Dr. [name] Cornett: Excision of mass left arm on 08/04/15  Pain: no  Mobility: no issues   SAFETY ISSUES:  Prior radiation? no  Pacemaker/ICD? no  Possible current pregnancy? N/A  Is the patient on methotrexate? No  Current Complaints / other details:  Patient reports the incision is scabbed over now.  BP 132/80 mmHg  Pulse 55  Temp(Src) 98.1 F (36.7 C) (Oral)  Ht 6\' 1"  (1.854 m)  Wt 299 lb 6.4 oz (135.807 kg)  BMI 39.51 kg/m2  SpO2 100%

## 2015-08-18 ENCOUNTER — Ambulatory Visit
Admission: RE | Admit: 2015-08-18 | Discharge: 2015-08-18 | Disposition: A | Discharge status: Home / Self Care | Payer: Commercial Managed Care - HMO | Source: Ambulatory Visit | Attending: Radiation Oncology | Admitting: Radiation Oncology

## 2015-08-18 ENCOUNTER — Encounter: Payer: Self-pay | Admitting: Radiation Oncology

## 2015-08-18 VITALS — BP 132/80 | HR 55 | Temp 98.1°F | Ht 73.0 in | Wt 299.4 lb

## 2015-08-18 DIAGNOSIS — F1010 Alcohol abuse, uncomplicated: Secondary | ICD-10-CM | POA: Diagnosis not present

## 2015-08-18 DIAGNOSIS — C4910 Malignant neoplasm of connective and soft tissue of unspecified upper limb, including shoulder: Secondary | ICD-10-CM | POA: Insufficient documentation

## 2015-08-18 DIAGNOSIS — C4912 Malignant neoplasm of connective and soft tissue of left upper limb, including shoulder: Secondary | ICD-10-CM

## 2015-08-18 DIAGNOSIS — Z833 Family history of diabetes mellitus: Secondary | ICD-10-CM | POA: Insufficient documentation

## 2015-08-18 DIAGNOSIS — Z800 Family history of malignant neoplasm of digestive organs: Secondary | ICD-10-CM | POA: Diagnosis not present

## 2015-08-18 NOTE — Progress Notes (Signed)
[facility] NEW PATIENT EVALUATION  Name: Gary [name] MRN: [id number]  Date:   08/18/2015           DOB: 04/29/1956  Status: outpatient   CC: Kathlene November, MD  [name], MD    REFERRING PHYSICIAN: [name], MD   DIAGNOSIS: Stage IB (T2b N0 M0 G1) vs. 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 male who is seen today through the courtesy of Dr. Brantley Stage for evaluation of his soft tissue neoplasm arising from his proximal left upper extremity.  The patient tells me that he first noted a mass along the upper left arm just over a year ago.  He states that he had a "shot" in the left deltoid adjacent to the mass which then "caused the mass to grow".  There is also a history of him having seen a chiropractor who " rolled it". This was brought to the attention of Dr. Larose Kells who obtained ultrasound and then a MRI scan on 05/23/2015 which showed a 6.5 x 4.5 x 2.5 cm neoplasm in the subcutaneous fat of the lateral aspect of the left upper arm superficial to the brachialis muscle.  This was worrisome for malignancy.  The patient was seen by Dr. Brantley Stage who performed an excision of a well demarcated 6 cm mass.  He took a rim of fatty tissue down to the triceps muscle and this was separate from the mass and not invading the mass.  The mass was entirely removed.  On review of his pathology he was found to have a 5.2 x 4.5 x 2.9 cm well-defined mass with a 6.3 x 6 x 3.5 cm fatty tissue.  The mass abutted the area of focally disrupted posterior margin and the mass came to within 0.1 cm of the anterior margin.  The remaining margins were widely free.  The pathology was sent to the [facility] where he was felt to have a solitary fibrous tumor with myxoid change having a low to intermediate risk for aggressive behavior.  The pathologist felt that "reexcision showing negative  margins and careful long-term follow-up would be appropriate" (Dr. Jeani Hawking).  The patient is doing well and he plans to return to work tomorrow.   PREVIOUS RADIATION THERAPY: No   PAST MEDICAL HISTORY:  has a past medical history of Lipoma of arm.     PAST SURGICAL HISTORY:  Past Surgical History  Procedure Laterality Date  . Inguinal hernia repair  2000    unilateral  . Mass excision Left 08/04/2015    Procedure: EXCISION OF MASS ON LEFT ARM;  Surgeon: [name], MD;  Location: [place];  Service: General;  Laterality: Left;     FAMILY HISTORY: family history includes Colon cancer in his father; Diabetes in his mother; Hypertension in his mother. There is no history of Heart attack or Prostate cancer.  His father died of colon cancer 71.  His mother is alive and well at 22.  No family history of sarcoma.   SOCIAL HISTORY:  reports that he has never smoked. He does not have any smokeless tobacco history on file. He reports that he drinks alcohol. He reports that he does not use illicit drugs. Remarried, 4 children.  He works for [employer] as a Development worker, community.   ALLERGIES: Review of patient's allergies  indicates no known allergies.   MEDICATIONS:  Current Outpatient Prescriptions  Medication Sig Dispense Refill  . Multiple Vitamin (MULTIVITAMIN WITH MINERALS) TABS tablet Take 1 tablet by mouth daily.    Marland Kitchen oxyCODONE-acetaminophen (ROXICET) 5-325 MG tablet Take 1 tablet by mouth every 4 (four) hours as needed. (Patient not taking: Reported on 08/18/2015) 30 tablet 0   No current facility-administered medications for this encounter.     REVIEW OF SYSTEMS:  Pertinent items are noted in HPI.    PHYSICAL EXAM:  height is 6\' 1"  (1.854 m) and weight is 299 lb 6.4 oz (135.807 kg). His oral temperature is 98.1 F (36.7 C). His blood pressure is 132/80 and his pulse is 55. His oxygen saturation is 100%.   Alert and oriented and  well-developed's 59 year old black male appearing his stated age.  Nodes: There is no palpable supraclavicular, infraclavicular, or left axillary lymphadenopathy.  On inspection of the left proximal upper extremity there is a vertical 6 cm wound, adjacent to the brachialis muscle region, which appears to be healing well. (See photo below). There is a small amount of drainage seen on his T shirt.  Neurovascular examination is within normal limits.       LABORATORY DATA:  Lab Results  Component Value Date   WBC 5.0 07/31/2015   HGB 13.7 07/31/2015   HCT 42.9 07/31/2015   MCV 82.7 07/31/2015   PLT 246 07/31/2015   Lab Results  Component Value Date   NA 137 07/31/2015   K 4.7 07/31/2015   CL 104 07/31/2015   CO2 26 07/31/2015   Lab Results  Component Value Date   ALT 24 07/31/2015   AST 18 07/31/2015   ALKPHOS 48 07/31/2015   BILITOT 0.8 07/31/2015      IMPRESSION:  Stage IB (T2b N0 M0 G1) vs. 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 Swoboda that he has a low to intermediate grade neoplasm that is either grade 1 or grade 2.  The risk for local recurrence following surgical excision is related to tumor grade, most importantly, but also surgical margins, size, and location.  In his favor is a superficial location, and well-circumscribed neoplasm seen on MRI scan, and also at the time of his surgical excision.  Even for low-grade tumors, one would like a 1.0 cm margin, if possible to reduce the risk for local recurrence in order to avoid postoperative radiation therapy based on NCCN guidelines.  One would also prefer an intact fascial plane to avoid radiation therapy.  It appears that his margins are potentially close posteriorly where the margin was focally disrupted.  The mass may also be close anteriorly.  Based on these findings, I feel that he should have a wide excision of at least 1 cm, therefore possibly avoiding  the need for postoperative radiation therapy.  We also discussed potential acute and late toxicities of radiation therapy.  I also told the patient that he is welcome to seek a second opinion at a university setting.  As expected, the patient would prefer to avoid further surgery radiation therapy, but I think his risk for recurrent disease is not insignificant without further surgery.  I will share my thoughts with Dr. Brantley Stage.  The patient will see Dr. Brantley Stage for a follow-up visit next week on November 15.    PLAN: As discussed above.  I spent 45  minutes face to face  with the patient and more than 50% of that time was spent in counseling and/or coordination of care.

## 2015-08-18 NOTE — Progress Notes (Signed)
Please see the Nurse Progress Note in the MD Initial Consult Encounter for this patient. 

## 2015-08-18 NOTE — Addendum Note (Signed)
Encounter addended by: Benn Moulder, RN on: 08/18/2015 11:00 AM<BR>     Documentation filed: Charges VN

## 2015-08-19 ENCOUNTER — Encounter: Payer: Self-pay | Admitting: Internal Medicine

## 2015-08-24 ENCOUNTER — Telehealth: Payer: Self-pay | Admitting: Oncology

## 2015-08-24 NOTE — Telephone Encounter (Signed)
Pt wasn't aware of the referral and want to consult with referring dr. Before scheduling appt.

## 2015-09-07 ENCOUNTER — Encounter: Payer: Self-pay | Admitting: Internal Medicine

## 2015-11-20 ENCOUNTER — Ambulatory Visit (INDEPENDENT_AMBULATORY_CARE_PROVIDER_SITE_OTHER): Payer: Commercial Managed Care - HMO | Admitting: Internal Medicine

## 2015-11-20 ENCOUNTER — Encounter: Payer: Self-pay | Admitting: Internal Medicine

## 2015-11-20 VITALS — BP 124/76 | HR 64 | Temp 98.1°F | Ht 73.0 in | Wt 298.0 lb

## 2015-11-20 DIAGNOSIS — E119 Type 2 diabetes mellitus without complications: Secondary | ICD-10-CM | POA: Diagnosis not present

## 2015-11-20 DIAGNOSIS — E785 Hyperlipidemia, unspecified: Secondary | ICD-10-CM

## 2015-11-20 LAB — LIPID PANEL
Cholesterol: 232 mg/dL — ABNORMAL HIGH (ref 0–200)
HDL: 48.4 mg/dL (ref >39.00)
LDL Cholesterol: 151 mg/dL — ABNORMAL HIGH (ref 0–99)
NONHDL: 183.46
Total CHOL/HDL Ratio: 5
Triglycerides: 161 mg/dL — ABNORMAL HIGH (ref 0.0–149.0)
VLDL: 32.2 mg/dL (ref 0.0–40.0)

## 2015-11-20 LAB — HEMOGLOBIN A1C: HEMOGLOBIN A1C: 6.4 % (ref 4.6–6.5)

## 2015-11-20 LAB — MICROALBUMIN / CREATININE URINE RATIO
Creatinine,U: 165.7 mg/dL
MICROALB/CREAT RATIO: 13.7 mg/g (ref 0.0–30.0)
Microalb, Ur: 22.7 mg/dL — ABNORMAL HIGH (ref 0.0–1.9)

## 2015-11-20 NOTE — Patient Instructions (Signed)
GO TO THE LAB : Get the blood work      We'll see you in July for a physical exam  Start taking an aspirin 81 mg every day  Watch your diet, stay active.

## 2015-11-20 NOTE — Progress Notes (Signed)
Pre visit review using our clinic review tool, if applicable. No additional management support is needed unless otherwise documented below in the visit note. 

## 2015-11-20 NOTE — Progress Notes (Signed)
   Subjective:    Patient ID: Gary [name], male    DOB: 1956/05/27, 60 y.o.   MRN: [id number]  DOS:  11/20/2015 Type of visit - description : Routine visit Interval history:  History of diabetes, having a hard time with portion control, he remains active. No ambulatory CBGs. Also, diagnosed with a soft tissue cancer, notes reviewed.   Review of Systems Denies any visual disturbances, no lower extremity paresthesias  Past Medical History  Diagnosis Date  . Lipoma of arm 2016    low to intermediate risk for aggresive behavior, Pt declined surgery    Past Surgical History  Procedure Laterality Date  . Inguinal hernia repair  2000    unilateral  . Mass excision Left 08/04/2015    Procedure: EXCISION OF MASS ON LEFT ARM;  Surgeon: [name], MD;  Location: [place];  Service: General;  Laterality: Left;    Social History   Social History  . Marital Status: Married    Spouse Name: N/A  . Number of Children: 4  . Years of Education: N/A   Occupational History  . city of [place], [place]     Social History Main Topics  . Smoking status: Never Smoker   . Smokeless tobacco: Not on file  . Alcohol Use: 0.0 oz/week    0 Standard drinks or equivalent per week     Comment: rarely  . Drug Use: No  . Sexual Activity: Not on file   Other Topics Concern  . Not on file   Social History Narrative   Occupation: works for the [employer] (not sedentary)    lost wife 06-2014 , breast cancer   2 children 75-15 y/o live w/ him              Medication List       This list is accurate as of: 11/20/15 11:59 PM.  Always use your most recent med list.               aspirin 81 MG tablet  Take 81 mg by mouth daily.     multivitamin with minerals Tabs tablet  Take 1 tablet by mouth daily.           Objective:   Physical Exam BP 124/76 mmHg  Pulse 64  Temp(Src) 98.1 F (36.7 C) (Oral)  Ht 6\' 1"  (1.854 m)  Wt 298 lb (135.172 kg)  BMI 39.32 kg/m2   SpO2 97% General:   Well developed, well nourished . NAD.  HEENT:  Normocephalic . Face symmetric, atraumatic Lungs:  CTA B Normal respiratory effort, no intercostal retractions, no accessory muscle use. Heart: RRR,  no murmur.  No pretibial edema bilaterally  Diabetes for exam: Normal skin, pulses and pinprick examination Neurologic:  alert & oriented X3.  Speech normal, gait appropriate for age and unassisted Psych--  Cognition and judgment appear intact.  Cooperative with normal attention span and concentration.  Behavior appropriate. No anxious or depressed appearing.      Assessment & Plan:   Assessment Diabetes, A1c 6.7 2006 Leiomyosarcoma, L arm  Dx 07-2015 , saw Dr Valere Dross, rec wider excision, pt d/w surgery and declined; plan is to see surgery q 6 months   PLAN 11-19-2014 DM: Encourage to continue working on a better diet and exercise. Foot exam negative today, recommend an eye check yearly, will check A1c, micro, FLP. Start aspirin for cardiovascular protection. Primary care: Declined a flu shot RTC 04-2016 CPX

## 2015-11-25 MED ORDER — ATORVASTATIN CALCIUM 20 MG PO TABS: 20.0000 mg | ORAL_TABLET | Freq: Every day | ORAL | Status: DC

## 2015-11-25 NOTE — Addendum Note (Signed)
Addended byDamita Dunnings D on: 11/25/2015 08:24 AM   Modules accepted: Orders

## 2016-01-15 ENCOUNTER — Encounter: Payer: 59 | Admitting: Internal Medicine

## 2016-02-10 ENCOUNTER — Telehealth: Payer: Self-pay | Admitting: Internal Medicine

## 2016-02-10 ENCOUNTER — Encounter: Payer: Self-pay | Admitting: Internal Medicine

## 2016-02-10 ENCOUNTER — Ambulatory Visit (HOSPITAL_BASED_OUTPATIENT_CLINIC_OR_DEPARTMENT_OTHER)
Admission: RE | Admit: 2016-02-10 | Discharge: 2016-02-10 | Disposition: A | Discharge status: Home / Self Care | Payer: Commercial Managed Care - HMO | Source: Ambulatory Visit | Attending: Internal Medicine | Admitting: Internal Medicine

## 2016-02-10 ENCOUNTER — Ambulatory Visit (INDEPENDENT_AMBULATORY_CARE_PROVIDER_SITE_OTHER): Payer: Commercial Managed Care - HMO | Admitting: Internal Medicine

## 2016-02-10 VITALS — BP 138/80 | HR 54 | Temp 97.9°F | Ht 73.0 in | Wt 298.0 lb

## 2016-02-10 DIAGNOSIS — R030 Elevated blood-pressure reading, without diagnosis of hypertension: Secondary | ICD-10-CM | POA: Diagnosis not present

## 2016-02-10 DIAGNOSIS — R42 Dizziness and giddiness: Secondary | ICD-10-CM | POA: Diagnosis not present

## 2016-02-10 DIAGNOSIS — E785 Hyperlipidemia, unspecified: Secondary | ICD-10-CM | POA: Diagnosis not present

## 2016-02-10 DIAGNOSIS — IMO0001 Reserved for inherently not codable concepts without codable children: Secondary | ICD-10-CM

## 2016-02-10 DIAGNOSIS — C499 Malignant neoplasm of connective and soft tissue, unspecified: Secondary | ICD-10-CM | POA: Diagnosis not present

## 2016-02-10 DIAGNOSIS — Z09 Encounter for follow-up examination after completed treatment for conditions other than malignant neoplasm: Secondary | ICD-10-CM

## 2016-02-10 MED ORDER — MECLIZINE HCL 12.5 MG PO TABS: 12.5000 mg | ORAL_TABLET | Freq: Three times a day (TID) | ORAL | Status: DC | PRN

## 2016-02-10 NOTE — Progress Notes (Signed)
Pre visit review using our clinic review tool, if applicable. No additional management support is needed unless otherwise documented below in the visit note. 

## 2016-02-10 NOTE — Patient Instructions (Signed)
Go downstairs and get your scan now  Please consider go back on atorvastatin for cholesterol  Check the  blood pressure daily  Be sure your blood pressure is between 110/65 and  135/85.  if it is consistently higher or lower, let me know   Schedule a follow-up 3 weeks from today  Rest, drink plenty of fluids, take Antivert as needed for dizziness. May cause drowsiness   If you have severe dizziness, you are not gradually better, you have a headache or any slurred speech or difficulty with the vision : Go to the ER

## 2016-02-10 NOTE — Telephone Encounter (Signed)
Pt called in stating he has been dizzy and nauseous. He took bp at home last night of 156/104. This morning at work the medical department took bp and it was 145/94. Transferred to [facility].

## 2016-02-10 NOTE — Progress Notes (Signed)
Subjective:    Patient ID: Gary [name], male    DOB: 1956-05-20, 60 y.o.   MRN: [id number]  DOS:  02/10/2016 Type of visit - description : Acute visit Interval history: Yesterday, he woke up and felt dizzy, described as woozy feeling.  Later on he developed some nausea. Symptoms were worse when he leans over or stood up  Not describing as spinning. Went to his pharmacy, BP was 154/104. Today, he feels slightly lightheaded, no nausea. BP at work earlier today 145/94. He volunteer the following information: Under a lot of stress, not eating healthy and he wonders if somewhat symptoms are related to that.   Also, was prescribed Lipitor, took it for 3 days, self DC. States he simply does not like to take too much medicine.  BP Readings from Last 3 Encounters:  02/10/16 138/80  11/20/15 124/76  08/18/15 132/80     Review of Systems Denies chest pain or difficulty breathing No headache, diplopia, slurred speech or motor deficits.  Past Medical History  Diagnosis Date  . Lipoma of arm 2016    low to intermediate risk for aggresive behavior, Pt declined surgery    Past Surgical History  Procedure Laterality Date  . Inguinal hernia repair  2000    unilateral  . Mass excision Left 08/04/2015    Procedure: EXCISION OF MASS ON LEFT ARM;  Surgeon: [name], MD;  Location: [place];  Service: General;  Laterality: Left;    Social History   Social History  . Marital Status: Married    Spouse Name: N/A  . Number of Children: 4  . Years of Education: N/A   Occupational History  . city of [place], water     Social History Main Topics  . Smoking status: Never Smoker   . Smokeless tobacco: Not on file  . Alcohol Use: 0.0 oz/week    0 Standard drinks or equivalent per week     Comment: rarely  . Drug Use: No  . Sexual Activity: Not on file   Other Topics Concern  . Not on file   Social History Narrative   Occupation: works for the [employer] (not  sedentary)    lost wife 06-2014 , breast cancer   2 children 85-15 y/o live w/ him              Medication List       This list is accurate as of: 02/10/16 11:59 PM.  Always use your most recent med list.               aspirin 81 MG tablet  Take 81 mg by mouth daily.     atorvastatin 20 MG tablet  Commonly known as:  LIPITOR  Take 1 tablet (20 mg total) by mouth daily.     meclizine 12.5 MG tablet  Commonly known as:  ANTIVERT  Take 1 tablet (12.5 mg total) by mouth 3 (three) times daily as needed for dizziness.     multivitamin with minerals Tabs tablet  Take 1 tablet by mouth daily.           Objective:   Physical Exam BP 138/80 mmHg  Pulse 54  Temp(Src) 97.9 F (36.6 C) (Oral)  Ht 6\' 1"  (1.854 m)  Wt 298 lb (135.172 kg)  BMI 39.32 kg/m2  SpO2 98%  General:   Well developed, well nourished . NAD.  HEENT:  Normocephalic . Face symmetric, atraumatic Lungs:  CTA B Normal  respiratory effort, no intercostal retractions, no accessory muscle use. Heart: RRR,  no murmur.  No pretibial edema bilaterally  Skin: Not pale. Not jaundice Neurologic:  alert & oriented X3.  Speech normal, gait appropriate for age and unassisted EOMI, undilated funduscopy with normal vessels, sharp disc Motor and DTRs symmetric Psych--  Cognition and judgment appear intact.  Cooperative with normal attention span and concentration.  Behavior appropriate. No anxious or depressed appearing.      Assessment & Plan:  Assessment Diabetes, A1c 6.7 2006 Hyperlipidemia  Leiomyosarcoma, L arm  Surgery  07-2015 , saw Dr Valere Dross, rec wider excision, pt d/w surgery and declined; plan is to see surgery q 6 months   Plan: Dizziness as described above, the patient has mild diabetes, apparently BP was elevated and has untreated high cholesterol. To be sure we get a CT head, otherwise recommend rest, fluids, Antivert. See instructions. Elevated BP? BP has been elevated in the last 24 hours,  the patient does not like to take medication, we agreed on monitoring BP. See instructions. Hyperlipidemia: Not taking Lipitor, explained the benefits of treating cholesterol. Asked patient to reconsider Leiomyosarcoma: The patient reports he saw surgery few weeks ago, and was told just to come back 6 months later (no notes available). I discussed with the patient the available office visit note from November 2016 >>> the surgeon clearly recommend re-excision but the patient declined. The patient has a different recollection from that visit ("they say I'm ok"). ? misunderstanding ; I suspect  Pt is in denial (?) . Advised  strongly to consider re-excision . RTC 3 weeks

## 2016-02-10 NOTE — Telephone Encounter (Signed)
[facility] - [facility]  Patient Name: Gary [name]  DOB: 01-Jun-1956    Initial Comment caller states he has dizziness and nausea, his BP is 145/94   Nurse Assessment  Nurse: Wayne Sever, RN, Tillie Rung Date/Time (Eastern Time): 02/10/2016 8:39:44 AM  Confirm and document reason for call. If symptomatic, describe symptoms. You must click the next button to save text entered. ---Caller states he is having some dizziness. He states his BP is 145/94 this morning. He does not take BP medication  Has the patient traveled out of the country within the last 30 days? ---Not Applicable  Does the patient have any new or worsening symptoms? ---Yes  Will a triage be completed? ---Yes  Related visit to physician within the last 2 weeks? ---No  Does the PT have any chronic conditions? (i.e. diabetes, asthma, etc.) ---No  Is this a behavioral health or substance abuse call? ---No     Guidelines    Guideline Title Affirmed Question Affirmed Notes  Dizziness - Lightheadedness [1] MODERATE dizziness (e.g., interferes with normal activities) AND [2] has NOT been evaluated by physician for this (Exception: dizziness caused by heat exposure, sudden standing, or poor fluid intake)    Final Disposition User   See Physician within 24 Hours [place], RN, Tillie Rung    Comments  Scheduled caller today at 245p, 05/03, with Dr Larose Kells   Referrals  REFERRED TO PCP OFFICE   Disagree/Comply: Comply

## 2016-02-11 DIAGNOSIS — IMO0001 Reserved for inherently not codable concepts without codable children: Secondary | ICD-10-CM | POA: Insufficient documentation

## 2016-02-11 DIAGNOSIS — R030 Elevated blood-pressure reading, without diagnosis of hypertension: Secondary | ICD-10-CM

## 2016-02-11 DIAGNOSIS — Z09 Encounter for follow-up examination after completed treatment for conditions other than malignant neoplasm: Secondary | ICD-10-CM | POA: Insufficient documentation

## 2016-02-11 NOTE — Assessment & Plan Note (Signed)
Dizziness as described above, the patient has mild diabetes, apparently BP was elevated and has untreated high cholesterol. To be sure we get a CT head, otherwise recommend rest, fluids, Antivert. See instructions. Elevated BP? BP has been elevated in the last 24 hours, the patient does not like to take medication, we agreed on monitoring BP. See instructions. Hyperlipidemia: Not taking Lipitor, explained the benefits of treating cholesterol. Asked patient to reconsider Leiomyosarcoma: The patient reports he saw surgery few weeks ago, and was told just to come back 6 months later (no notes available). I discussed with the patient the available office visit note from November 2016 >>> the surgeon clearly recommend re-excision but the patient declined. The patient has a different recollection from that visit ("they say I'm ok"). ? misunderstanding ; I suspect  Pt is in denial (?) . Advised  strongly to consider re-excision . RTC 3 weeks

## 2016-03-11 ENCOUNTER — Encounter: Payer: Self-pay | Admitting: Internal Medicine

## 2016-03-11 ENCOUNTER — Ambulatory Visit (INDEPENDENT_AMBULATORY_CARE_PROVIDER_SITE_OTHER): Payer: Commercial Managed Care - HMO | Admitting: Internal Medicine

## 2016-03-11 ENCOUNTER — Telehealth: Payer: Self-pay

## 2016-03-11 VITALS — BP 118/64 | HR 59 | Temp 98.1°F | Ht 73.0 in | Wt 294.0 lb

## 2016-03-11 DIAGNOSIS — R030 Elevated blood-pressure reading, without diagnosis of hypertension: Secondary | ICD-10-CM | POA: Diagnosis not present

## 2016-03-11 DIAGNOSIS — C499 Malignant neoplasm of connective and soft tissue, unspecified: Secondary | ICD-10-CM | POA: Diagnosis not present

## 2016-03-11 DIAGNOSIS — E119 Type 2 diabetes mellitus without complications: Secondary | ICD-10-CM

## 2016-03-11 DIAGNOSIS — IMO0001 Reserved for inherently not codable concepts without codable children: Secondary | ICD-10-CM

## 2016-03-11 LAB — HEMOGLOBIN A1C: Hgb A1c MFr Bld: 6.4 % (ref 4.6–6.5)

## 2016-03-11 NOTE — Assessment & Plan Note (Signed)
Dizziness: CT head was (-), sx resolved Elevated BP: BP normal today, reports ambulatory BPs wnl DM: Diet control, recheck a A1c. States he had an eye exam this year. Hyperlipidemia: Decided not to take Lipitor, LDL goals discuss, he remains reluctant to take medication. We'll reassess on RTC Leiomyosarcoma: States he saw his surgeon back in April 2017 and he was told:" Just come back in 6 months". I don't have the records of that visit, will request them Primary care: Due for a cscope ----> letter from GI represented CPX in 2-3 months.

## 2016-03-11 NOTE — Telephone Encounter (Signed)
Tried calling [facility] to get last OV notes from Dr. Brantley Stage, last OV notes from 08/25/2015, no answer at medical records dept. Fax sent to [phone] requesting OV notes since last received on 08/25/2015. Received fax confirmation on 03/11/2016 1145.

## 2016-03-11 NOTE — Patient Instructions (Signed)
GO TO THE LAB : Get the blood work     GO TO THE FRONT DESK Schedule your next appointment for a  Physical in 2-3 months from today, fasting

## 2016-03-11 NOTE — Progress Notes (Signed)
Subjective:    Patient ID: Gary [name], male    DOB: May 07, 1956, 60 y.o.   MRN: [id number]  DOS:  03/11/2016 Type of visit - description : Follow-up Interval history:  Had dizziness, symptoms resolve Hyperlipidemia: Decided not to take Lipitor DM, doing okay with diet and exercise.  Review of Systems  Denies chest pain, difficulty breathing No nausea, vomiting, diarrhea Past Medical History  Diagnosis Date  . Lipoma of arm 2016    low to intermediate risk for aggresive behavior, Pt declined surgery    Past Surgical History  Procedure Laterality Date  . Inguinal hernia repair  2000    unilateral  . Mass excision Left 08/04/2015    Procedure: EXCISION OF MASS ON LEFT ARM;  Surgeon: [name], MD;  Location: [place];  Service: General;  Laterality: Left;    Social History   Social History  . Marital Status: Married    Spouse Name: N/A  . Number of Children: 4  . Years of Education: N/A   Occupational History  . city of [place], [place]     Social History Main Topics  . Smoking status: Never Smoker   . Smokeless tobacco: Not on file  . Alcohol Use: 0.0 oz/week    0 Standard drinks or equivalent per week     Comment: rarely  . Drug Use: No  . Sexual Activity: Not on file   Other Topics Concern  . Not on file   Social History Narrative   Occupation: works for the [employer] (not sedentary)    lost wife 06-2014 , breast cancer   2 children 80-15 y/o live w/ him              Medication List       This list is accurate as of: 03/11/16  5:17 PM.  Always use your most recent med list.               aspirin 81 MG tablet  Take 81 mg by mouth daily.           Objective:   Physical Exam BP 118/64 mmHg  Pulse 59  Temp(Src) 98.1 F (36.7 C) (Oral)  Ht 6\' 1"  (1.854 m)  Wt 294 lb (133.358 kg)  BMI 38.80 kg/m2  SpO2 97% General:   Well developed, well nourished . NAD.  HEENT:  Normocephalic . Face symmetric, atraumatic Lungs:    CTA B Normal respiratory effort, no intercostal retractions, no accessory muscle use. Heart: RRR,  no murmur.  No pretibial edema bilaterally  Skin: Not pale. Not jaundice Neurologic:  alert & oriented X3.  Speech normal, gait appropriate for age and unassisted Psych--  Cognition and judgment appear intact.  Cooperative with normal attention span and concentration.  Behavior appropriate. No anxious or depressed appearing.      Assessment & Plan:   Assessment Diabetes, A1c 6.7 2006 Hyperlipidemia  Leiomyosarcoma, L arm  Surgery  07-2015 , saw Dr Valere Dross, rec wider excision, pt d/w surgery and declined; plan is to see surgery q 6 months   PLAN: Dizziness: CT head was (-), sx resolved Elevated BP: BP normal today, reports ambulatory BPs wnl DM: Diet control, recheck a A1c. States he had an eye exam this year. Hyperlipidemia: Decided not to take Lipitor, LDL goals discuss, he remains reluctant to take medication. We'll reassess on RTC Leiomyosarcoma: States he saw his surgeon back in April 2017 and he was told:" Just come back in  6 months". I don't have the records of that visit, will request them Primary care: Due for a cscope ----> letter from GI represented CPX in 2-3 months.

## 2016-03-11 NOTE — Progress Notes (Signed)
Pre visit review using our clinic review tool, if applicable. No additional management support is needed unless otherwise documented below in the visit note. 

## 2016-03-14 NOTE — Telephone Encounter (Signed)
Letter printed and mailed to Pt.  

## 2016-03-14 NOTE — Telephone Encounter (Signed)
Note from surgery reviewed, please send a letter  Gary [name],  I had the chance to review the surgery office visit note from 01/29/2016. Your surgeon actually thinks that your tumor is prone to recurrence and metastasis, in other words you are at risk of the cancer coming back. The best way to prevent that from happening is to do  further surgery  or radiation therapy on your arm. Your chance of recurrence of cancer his 40% which is very high. I just like to be sure that you understand the situation, if you change your mind and like to proceed with surgery or radiation therapy please contact me on your surgeon.

## 2016-03-14 NOTE — Telephone Encounter (Signed)
Received OV notes from [facility], forwarded to Dr. Larose Kells.

## 2016-04-15 ENCOUNTER — Encounter: Payer: Commercial Managed Care - HMO | Admitting: Internal Medicine

## 2016-06-08 ENCOUNTER — Ambulatory Visit (INDEPENDENT_AMBULATORY_CARE_PROVIDER_SITE_OTHER): Payer: Commercial Managed Care - HMO | Admitting: Internal Medicine

## 2016-06-08 ENCOUNTER — Encounter: Payer: Self-pay | Admitting: Internal Medicine

## 2016-06-08 VITALS — BP 126/74 | HR 75 | Temp 97.8°F | Resp 14 | Ht 73.0 in | Wt 295.1 lb

## 2016-06-08 DIAGNOSIS — Z0000 Encounter for general adult medical examination without abnormal findings: Secondary | ICD-10-CM | POA: Diagnosis not present

## 2016-06-08 NOTE — Progress Notes (Signed)
Pre visit review using our clinic review tool, if applicable. No additional management support is needed unless otherwise documented below in the visit note. 

## 2016-06-08 NOTE — Patient Instructions (Signed)
  GO TO THE FRONT DESK Schedule your next appointment for a  routine check up in 6 months   Schedule labs to be done tomorrow

## 2016-06-08 NOTE — Assessment & Plan Note (Addendum)
Td 01-2009; pnm 23 : 2016; prevnar: declined 05-2016;  Flu shot- declined 05-2016. Benefits discussed  zostavax discussed before  Cscope 2006 (-) no polyps, second Cscope 07-2010 (-), next is due, pt hesitant to pursue a cscope, benefits of early cancer detection discussed;  letter from GI reprinted fir him  Last DRE 2016, PSA 2016 wnl  Diet and exercise discussed   Labs : CMP, FLP, CBC

## 2016-06-08 NOTE — Progress Notes (Signed)
Subjective:    Patient ID: Gary [name], male    DOB: 1956-08-06, 60 y.o.   MRN: [id number]  DOS:  06/08/2016 Type of visit - description : CPX Interval history: Reports no concerns    Review of Systems Constitutional: No fever. No chills. No unexplained wt changes. No unusual sweats  HEENT: No dental problems, no ear discharge, no facial swelling, no voice changes. No eye discharge, no eye  redness , no  intolerance to light   Respiratory: No wheezing , no  difficulty breathing. No cough , no mucus production  Cardiovascular: No CP, no leg swelling , no  Palpitations  GI: no nausea, no vomiting, no diarrhea , no  abdominal pain.  No blood in the stools. No dysphagia, no odynophagia    Endocrine: No polyphagia, no polyuria , no polydipsia  GU: No dysuria, gross hematuria, difficulty urinating. No urinary urgency, no frequency.  Musculoskeletal: No joint swellings or unusual aches or pains  Skin: No change in the color of the skin, palor , no  Rash  Allergic, immunologic: No environmental allergies , no  food allergies  Neurological: No dizziness no  syncope. No headaches. No diplopia, no slurred, no slurred speech, no motor deficits, no facial  Numbness  Hematological: No enlarged lymph nodes, no easy bruising , no unusual bleedings  Psychiatry: No suicidal ideas, no hallucinations, no beavior problems, no confusion.  No unusual/severe anxiety, no depression   Past Medical History:  Diagnosis Date  . Lipoma of arm 2016    Past Surgical History:  Procedure Laterality Date  . INGUINAL HERNIA REPAIR  2000   unilateral  . MASS EXCISION Left 08/04/2015   Procedure: EXCISION OF MASS ON LEFT ARM;  Surgeon: [name], MD;  Location: [facility];  Service: General;  Laterality: Left;    Social History   Social History  . Marital status: Married    Spouse name: N/A  . Number of children: 4  . Years of education: N/A   Occupational History  . city  [place], [place]     Social History Main Topics  . Smoking status: Never Smoker  . Smokeless tobacco: Never Used  . Alcohol use 0.0 oz/week     Comment: rarely  . Drug use: No  . Sexual activity: Not on file   Other Topics Concern  . Not on file   Social History Narrative   Occupation: works for the [employer] (not sedentary)    lost wife 06-2014 , breast cancer   2 children  2001, 2003--- live w/ him           Family History  Problem Relation Age of Onset  . Hypertension Mother   . Diabetes Mother   . Colon cancer Father     <60  . Heart attack Neg Hx   . Prostate cancer Neg Hx        Medication List       Accurate as of 06/08/16 11:59 PM. Always use your most recent med list.          aspirin 81 MG tablet Take 81 mg by mouth daily.          Objective:   Physical Exam BP 126/74 (BP Location: Left Arm, Patient Position: Sitting, Cuff Size: Normal)   Pulse 75   Temp 97.8 F (36.6 C) (Oral)   Resp 14   Ht 6\' 1"  (1.854 m)   Wt 295 lb 2 oz (133.9  kg)   SpO2 97%   BMI 38.94 kg/m   General:   Well developed, well nourished . NAD.  Neck: No  thyromegaly  HEENT:  Normocephalic . Face symmetric, atraumatic Lungs:  CTA B Normal respiratory effort, no intercostal retractions, no accessory muscle use. Heart: RRR,  no murmur.  No pretibial edema bilaterally  Abdomen:  Not distended, soft, non-tender. No rebound or rigidity.   Skin: Exposed areas without rash. Not pale. Not jaundice. Left arm --+keloid scar at site of surgery but otherwise looks okay me Neurologic:  alert & oriented X3.  Speech normal, gait appropriate for age and unassisted Strength symmetric and appropriate for age.  Psych: Cognition and judgment appear intact.  Cooperative with normal attention span and concentration.  Behavior appropriate. No anxious or depressed appearing.    Assessment & Plan:     Assessment Diabetes, A1c 6.7 2006 Hyperlipidemia  Leiomyosarcoma, L arm   Surgery  07-2015 , saw Dr Valere Dross, rec wider excision, pt d/w surgery and declined; plan is to see surgery q 6 months   PLAN: DM: last a1c great Hyperlipidemia: Diet control, check labs  Leiomyosarcoma: Since the last time I saw him, I got the records from surgery, he has a 40% chance of recurrence , patient is  aware, he plans to take no action until he sees his surgeon in October. I sent him a letter a few weeks ago explaining the situation  and printed it for him today. I expressed my concerns about the cancer coming back. RTC 6 months

## 2016-06-09 ENCOUNTER — Other Ambulatory Visit (INDEPENDENT_AMBULATORY_CARE_PROVIDER_SITE_OTHER): Payer: Commercial Managed Care - HMO

## 2016-06-09 DIAGNOSIS — Z0000 Encounter for general adult medical examination without abnormal findings: Secondary | ICD-10-CM | POA: Diagnosis not present

## 2016-06-09 LAB — COMPREHENSIVE METABOLIC PANEL
ALBUMIN: 4.1 g/dL (ref 3.5–5.2)
ALK PHOS: 50 U/L (ref 39–117)
ALT: 21 U/L (ref 0–53)
AST: 16 U/L (ref 0–37)
BUN: 13 mg/dL (ref 6–23)
CALCIUM: 8.7 mg/dL (ref 8.4–10.5)
CHLORIDE: 107 meq/L (ref 96–112)
CO2: 26 mEq/L (ref 19–32)
Creatinine, Ser: 1.11 mg/dL (ref 0.40–1.50)
GFR: 87.48 mL/min (ref >60.00)
Glucose, Bld: 103 mg/dL — ABNORMAL HIGH (ref 70–99)
POTASSIUM: 3.9 meq/L (ref 3.5–5.1)
SODIUM: 140 meq/L (ref 135–145)
Total Bilirubin: 0.9 mg/dL (ref 0.2–1.2)
Total Protein: 7.2 g/dL (ref 6.0–8.3)

## 2016-06-09 LAB — CBC WITH DIFFERENTIAL/PLATELET
BASOS ABS: 0 10*3/uL (ref 0.0–0.1)
Basophils Relative: 0.5 % (ref 0.0–3.0)
EOS PCT: 1.7 % (ref 0.0–5.0)
Eosinophils Absolute: 0.1 10*3/uL (ref 0.0–0.7)
HCT: 43 % (ref 39.0–52.0)
HEMOGLOBIN: 14.4 g/dL (ref 13.0–17.0)
Lymphocytes Relative: 33.6 % (ref 12.0–46.0)
Lymphs Abs: 1.8 10*3/uL (ref 0.7–4.0)
MCHC: 33.5 g/dL (ref 30.0–36.0)
MCV: 79.9 fl (ref 78.0–100.0)
MONO ABS: 0.4 10*3/uL (ref 0.1–1.0)
Monocytes Relative: 7 % (ref 3.0–12.0)
Neutro Abs: 3.1 10*3/uL (ref 1.4–7.7)
Neutrophils Relative %: 57.2 % (ref 43.0–77.0)
Platelets: 263 10*3/uL (ref 150.0–400.0)
RBC: 5.38 Mil/uL (ref 4.22–5.81)
RDW: 15.1 % (ref 11.5–15.5)
WBC: 5.5 10*3/uL (ref 4.0–10.5)

## 2016-06-09 LAB — LIPID PANEL
CHOLESTEROL: 201 mg/dL — AB (ref 0–200)
HDL: 42.5 mg/dL (ref >39.00)
LDL CALC: 125 mg/dL — AB (ref 0–99)
NonHDL: 158.57
Total CHOL/HDL Ratio: 5
Triglycerides: 170 mg/dL — ABNORMAL HIGH (ref 0.0–149.0)
VLDL: 34 mg/dL (ref 0.0–40.0)

## 2016-06-09 NOTE — Assessment & Plan Note (Signed)
DM: last a1c great Hyperlipidemia: Diet control, check labs  Leiomyosarcoma: Since the last time I saw him, I got the records from surgery, he has a 40% chance of recurrence , patient is  aware, he plans to take no action until he sees his surgeon in October. I sent him a letter a few weeks ago explaining the situation  and printed it for him today. I expressed my concerns about the cancer coming back. RTC 6 months

## 2016-12-09 ENCOUNTER — Encounter: Payer: Self-pay | Admitting: Internal Medicine

## 2016-12-09 ENCOUNTER — Ambulatory Visit (INDEPENDENT_AMBULATORY_CARE_PROVIDER_SITE_OTHER): Payer: Commercial Managed Care - HMO | Admitting: Internal Medicine

## 2016-12-09 VITALS — BP 126/78 | HR 58 | Temp 97.8°F | Resp 14 | Ht 73.0 in | Wt 292.0 lb

## 2016-12-09 DIAGNOSIS — Z1159 Encounter for screening for other viral diseases: Secondary | ICD-10-CM

## 2016-12-09 DIAGNOSIS — L603 Nail dystrophy: Secondary | ICD-10-CM | POA: Diagnosis not present

## 2016-12-09 DIAGNOSIS — C499 Malignant neoplasm of connective and soft tissue, unspecified: Secondary | ICD-10-CM | POA: Diagnosis not present

## 2016-12-09 DIAGNOSIS — E119 Type 2 diabetes mellitus without complications: Secondary | ICD-10-CM | POA: Diagnosis not present

## 2016-12-09 LAB — MICROALBUMIN / CREATININE URINE RATIO
CREATININE, U: 199.6 mg/dL
MICROALB/CREAT RATIO: 8.1 mg/g (ref 0.0–30.0)
Microalb, Ur: 16.1 mg/dL — ABNORMAL HIGH (ref 0.0–1.9)

## 2016-12-09 LAB — HEMOGLOBIN A1C: HEMOGLOBIN A1C: 6.5 % (ref 4.6–6.5)

## 2016-12-09 LAB — HEPATITIS C ANTIBODY: HCV AB: NEGATIVE

## 2016-12-09 NOTE — Assessment & Plan Note (Signed)
DM: Diet control, check A1c, micro. Rec eye check yearly. Foot exam today is normal except for dystrophic nails, likely fungal. Like to see a podiatrist. Referral. High cholesterol: Strongly declined to take medication. Leiomyosarcoma. Was supposed to see surgery 07-2016, that never  happened. I again discussed the fact that he has a 40% chance of recurrence which is high, he is very clear that he does not desire anything done. "I won't do any surgery". Hep C screen today, strongly declined a flu shot CPX in 6 months

## 2016-12-09 NOTE — Patient Instructions (Signed)
GO TO THE LAB : Get the blood work     GO TO THE FRONT DESK Schedule your next appointment for a  day physical exam in 6-8 months

## 2016-12-09 NOTE — Progress Notes (Signed)
Pre visit review using our clinic review tool, if applicable. No additional management support is needed unless otherwise documented below in the visit note. 

## 2016-12-09 NOTE — Progress Notes (Signed)
Subjective:    Patient ID: Gary [name], male    DOB: 06-08-1956, 61 y.o.   MRN: [id number]  DOS:  12/09/2016 Type of visit - description : rov Interval history: No major concerns, taking aspirin only   Review of Systems Reports diet remains healthy. Has dystrophic nails, likes a referral. Denies any lower extremity paresthesias. No visual disturbances.   Past Medical History:  Diagnosis Date  . Diabetes-- A1c 6.7 (2016) 05/15/2015  . Leiomyosarcoma ([place]) 05/20/2015  . Lipoma of arm 2016    Past Surgical History:  Procedure Laterality Date  . INGUINAL HERNIA REPAIR  2000   unilateral  . MASS EXCISION Left 08/04/2015   Procedure: EXCISION OF MASS ON LEFT ARM;  Surgeon: [name], MD;  Location: [facility];  Service: General;  Laterality: Left;    Social History   Social History  . Marital status: Married    Spouse name: N/A  . Number of children: 4  . Years of education: N/A   Occupational History  . city [employer] [place], [place]     Social History Main Topics  . Smoking status: Never Smoker  . Smokeless tobacco: Never Used  . Alcohol use 0.0 oz/week     Comment: rarely  . Drug use: No  . Sexual activity: Not on file   Other Topics Concern  . Not on file   Social History Narrative   Occupation: works for the [employer] (not sedentary)    lost wife 06-2014 , breast cancer   2 children  2001, 2003--- live w/ him            Allergies as of 12/09/2016   No Known Allergies     Medication List       Accurate as of 12/09/16 12:02 PM. Always use your most recent med list.          aspirin 81 MG tablet Take 81 mg by mouth daily.          Objective:   Physical Exam BP 126/78 (BP Location: Left Arm, Patient Position: Sitting, Cuff Size: Normal)   Pulse (!) 58   Temp 97.8 F (36.6 C) (Oral)   Resp 14   Ht 6\' 1"  (1.854 m)   Wt 292 lb (132.5 kg)   SpO2 97%   BMI 38.52 kg/m  General:   Well developed, well nourished . NAD.  HEENT:    Normocephalic . Face symmetric, atraumatic Lungs:  CTA B Normal respiratory effort, no intercostal retractions, no accessory muscle use. Heart: RRR,  no murmur.  No pretibial edema bilaterally  Skin: Not pale. Not jaundice DIABETIC FEET EXAM: No lower extremity edema Normal pedal pulses bilaterally Skin normal, dystrophic nails, great toes, R>L Pinprick examination of the feet normal. Neurologic:  alert & oriented X3.  Speech normal, gait appropriate for age and unassisted Psych--  Cognition and judgment appear intact.  Cooperative with normal attention span and concentration.  Behavior appropriate. No anxious or depressed appearing.      Assessment & Plan:   Assessment Diabetes, A1c 6.7 2006 Hyperlipidemia  Leiomyosarcoma, L arm  Surgery  07-2015 , saw Dr Valere Dross, rec wider excision, pt d/w surgery and declined; plan is to see surgery q 6 months   PLAN: DM: Diet control, check A1c, micro. Rec eye check yearly. Foot exam today is normal except for dystrophic nails, likely fungal. Like to see a podiatrist. Referral. High cholesterol: Strongly declined to take medication. Leiomyosarcoma. Was supposed to  see surgery 07-2016, that never  happened. I again discussed the fact that he has a 40% chance of recurrence which is high, he is very clear that he does not desire anything done. "I won't do any surgery". Hep C screen today, strongly declined a flu shot CPX in 6 months

## 2016-12-27 ENCOUNTER — Ambulatory Visit (INDEPENDENT_AMBULATORY_CARE_PROVIDER_SITE_OTHER): Payer: Commercial Managed Care - HMO | Admitting: Podiatry

## 2016-12-27 ENCOUNTER — Encounter: Payer: Self-pay | Admitting: Podiatry

## 2016-12-27 VITALS — BP 147/96 | HR 69 | Resp 18

## 2016-12-27 DIAGNOSIS — L988 Other specified disorders of the skin and subcutaneous tissue: Secondary | ICD-10-CM | POA: Diagnosis not present

## 2016-12-27 DIAGNOSIS — B353 Tinea pedis: Secondary | ICD-10-CM

## 2016-12-27 DIAGNOSIS — B351 Tinea unguium: Secondary | ICD-10-CM | POA: Diagnosis not present

## 2016-12-27 DIAGNOSIS — Z79899 Other long term (current) drug therapy: Secondary | ICD-10-CM | POA: Diagnosis not present

## 2016-12-27 MED ORDER — TERBINAFINE HCL 250 MG PO TABS: 250.0000 mg | ORAL_TABLET | Freq: Every day | ORAL | 0 refills | Status: DC

## 2016-12-27 NOTE — Progress Notes (Signed)
   Subjective:    Patient ID: Gary [name], male    DOB: 1956/05/25, 61 y.o.   MRN: [id number]  HPI 61 year old male presents the office today for concerns of pain to his toenails as well as thickening mostly to his big toenails. He also states he has evidence left foot. He is tried over-the-counter Lamisil without any significant improvement. Denies any drainage or pain and swelling to his foot. Denies any redness to his foot. He has no other complaints today at this time.  Review of Systems  All other systems reviewed and are negative.      Objective:   Physical Exam General: AAO x3, NAD  Dermatological: Bilateral hallux nails are dystrophic, discolored, hypertrophic yellow-brown discoloration of the toenails with a right side worse and left. The fifth digit nails are also dystrophic, discolored and hypertrophic. There is interdigital maceration of the second, third, fourth interspaces on the right foot and there are superficial wounds present within the sulcus of the toes. There is no drainage or pus in there is no swelling, redness or ascending cellulitis. There is no malodor.  Vascular: Dorsalis Pedis artery and Posterior Tibial artery pedal pulses are 2/4 bilateral with immedate capillary fill time. There is no pain with calf compression, swelling, warmth, erythema.   Neruologic: Grossly intact via light touch bilateral. Vibratory intact via tuning fork bilateral. Protective threshold with Semmes Wienstein monofilament intact to all pedal sites bilateral.   Musculoskeletal: No gross boney pedal deformities bilateral. No pain, crepitus, or limitation noted with foot and ankle range of motion bilateral. Muscular strength 5/5 in all groups tested bilateral.  Gait: Unassisted, Nonantalgic.      Assessment & Plan:  61 year old male with onychodystrophy likely onychomycosis, interdigital maceration with wounds -Treatment options discussed including all alternatives, risks, and  complications -Etiology of symptoms were discussed -Castellani's paint was applied to the interspaces. Recommended Betadine painted in the interspaces with a gauze. Hold off on a Lamisil cream and any moisturizer interdigitally. Dry thoroughly between the toes. We will start 2 weeks of Lamisil as well. I also protective she'll need to have long-term Lamisil from toenail fungus. We'll go ahead and obtain LFTs and CBC. I discussed side effects the medication. I biopsied bilateral hallux toenail status was sent to [facility].  -RTC 2 weeks or sooner if needed.  Celesta Gentile, DPM

## 2016-12-27 NOTE — Patient Instructions (Signed)

## 2016-12-28 LAB — HEPATIC FUNCTION PANEL
ALT: 24 IU/L (ref 0–44)
AST: 16 IU/L (ref 0–40)
Albumin: 4.4 g/dL (ref 3.5–5.5)
Alkaline Phosphatase: 53 IU/L (ref 39–117)
Bilirubin Total: 0.4 mg/dL (ref 0.0–1.2)
Bilirubin, Direct: 0.12 mg/dL (ref 0.00–0.40)
Total Protein: 7.1 g/dL (ref 6.0–8.5)

## 2016-12-28 LAB — CBC WITH DIFFERENTIAL/PLATELET
BASOS ABS: 0 10*3/uL (ref 0.0–0.2)
Basos: 0 %
EOS (ABSOLUTE): 0.2 10*3/uL (ref 0.0–0.4)
Eos: 3 %
HEMOGLOBIN: 13.3 g/dL (ref 13.0–17.7)
Hematocrit: 41.6 % (ref 37.5–51.0)
IMMATURE GRANS (ABS): 0 10*3/uL (ref 0.0–0.1)
Immature Granulocytes: 0 %
LYMPHS: 33 %
Lymphocytes Absolute: 1.8 10*3/uL (ref 0.7–3.1)
MCH: 26.3 pg — ABNORMAL LOW (ref 26.6–33.0)
MCHC: 32 g/dL (ref 31.5–35.7)
MCV: 82 fL (ref 79–97)
MONOCYTES: 7 %
Monocytes Absolute: 0.4 10*3/uL (ref 0.1–0.9)
NEUTROS PCT: 57 %
Neutrophils Absolute: 3 10*3/uL (ref 1.4–7.0)
Platelets: 243 10*3/uL (ref 150–379)
RBC: 5.05 x10E6/uL (ref 4.14–5.80)
RDW: 15 % (ref 12.3–15.4)
WBC: 5.3 10*3/uL (ref 3.4–10.8)

## 2017-01-19 ENCOUNTER — Ambulatory Visit: Payer: Commercial Managed Care - HMO | Admitting: Podiatry

## 2017-01-26 ENCOUNTER — Encounter: Payer: Self-pay | Admitting: Podiatry

## 2017-01-26 ENCOUNTER — Ambulatory Visit (INDEPENDENT_AMBULATORY_CARE_PROVIDER_SITE_OTHER): Payer: Commercial Managed Care - HMO | Admitting: Podiatry

## 2017-01-26 DIAGNOSIS — L988 Other specified disorders of the skin and subcutaneous tissue: Secondary | ICD-10-CM | POA: Diagnosis not present

## 2017-01-26 DIAGNOSIS — B353 Tinea pedis: Secondary | ICD-10-CM

## 2017-01-26 DIAGNOSIS — B351 Tinea unguium: Secondary | ICD-10-CM

## 2017-01-26 MED ORDER — TERBINAFINE HCL 250 MG PO TABS: 250.0000 mg | ORAL_TABLET | Freq: Every day | ORAL | 0 refills | Status: DC

## 2017-01-26 NOTE — Patient Instructions (Signed)

## 2017-01-27 NOTE — Progress Notes (Signed)
Subjective: 61 year old male presents the office today for follow-up evaluation of interdigital maceration, tinea pedis. He was on Lamisil for 2 weeks and has been driving to the toes and feels that the wounds are doing much better and overall his feet are doing better. He hasn't no itching to his feet. He also presents a discussed nail culture results. Denies any redness or drainage from the toenail site and denies any open sores. Denies any systemic complaints such as fevers, chills, nausea, vomiting. No acute changes since last appointment, and no other complaints at this time.   Objective: AAO x3, NAD DP/PT pulses palpable bilaterally, CRT less than 3 seconds Mild interdigital macerations presents the left side worse than right however is no open sores identified at this time and overall this. Much improved. There is no drainage from the area. Has continue be hypertrophic, dystrophic, discolored with yellow to brown discoloration. There is no tenderness the tenderness there is no cellulitis or drainage. No open lesions or pre-ulcerative lesions identified otherwise. No pain with calf compression, swelling, warmth, erythema.  Assessment: Resolving tinea pedis/interdigital  maceration onychomycosis  Plan: -Treatment options discussed including all alternatives, risks, and complications -Etiology of symptoms were discussed -Continue to dry thoroughly between his toes. Monitor for any recurrence of the wounds. -Discussed nail fungus culture results which is positive for fungus. At this time given the thickened toenails recommend oral treatment. His previous blood work was normal. We'll start Lamisil for 60 days. Discussed side effects the medication long-term. Follow up in 6 weeks or sooner if needed. Repeat blood work next appt  Celesta Gentile, DPM

## 2017-02-03 DIAGNOSIS — H16141 Punctate keratitis, right eye: Secondary | ICD-10-CM | POA: Diagnosis not present

## 2017-02-03 DIAGNOSIS — E119 Type 2 diabetes mellitus without complications: Secondary | ICD-10-CM | POA: Diagnosis not present

## 2017-06-16 ENCOUNTER — Encounter: Payer: Self-pay | Admitting: Internal Medicine

## 2017-06-16 ENCOUNTER — Ambulatory Visit (INDEPENDENT_AMBULATORY_CARE_PROVIDER_SITE_OTHER): Payer: 59 | Admitting: Internal Medicine

## 2017-06-16 VITALS — BP 128/80 | HR 56 | Temp 98.6°F | Resp 14 | Ht 73.0 in | Wt 291.4 lb

## 2017-06-16 DIAGNOSIS — Z1211 Encounter for screening for malignant neoplasm of colon: Secondary | ICD-10-CM

## 2017-06-16 DIAGNOSIS — Z0000 Encounter for general adult medical examination without abnormal findings: Secondary | ICD-10-CM | POA: Diagnosis not present

## 2017-06-16 DIAGNOSIS — C499 Malignant neoplasm of connective and soft tissue, unspecified: Secondary | ICD-10-CM | POA: Diagnosis not present

## 2017-06-16 DIAGNOSIS — E119 Type 2 diabetes mellitus without complications: Secondary | ICD-10-CM | POA: Diagnosis not present

## 2017-06-16 NOTE — Assessment & Plan Note (Addendum)
-  Td 01-2009; pnm 23 : 2016; shingrex and flu shot discussed, declined  -CCS: +FH Cscope 2006 (-) no polyps, second Cscope 07-2010 (-) Next is due, has not pursue repeat cscope just yet, refer to GI -prostate ca screening: DRE negative, check a PSA  -Diet and exercise discussed   -Labs : CMP, FLP, CBC, A1c, micro, TSH, PSA

## 2017-06-16 NOTE — Progress Notes (Signed)
Pre visit review using our clinic review tool, if applicable. No additional management support is needed unless otherwise documented below in the visit note. 

## 2017-06-16 NOTE — Patient Instructions (Signed)
GO TO THE LAB : Get the blood work     GO TO THE FRONT DESK Schedule your next appointment for a physical exam in one year, sooner if needed  Please see your surgeon If you need a referral please let me know

## 2017-06-16 NOTE — Progress Notes (Signed)
Subjective:    Patient ID: Gary [name], male    DOB: [birth year], 61 y.o.   MRN: [id number]  DOS:  06/16/2017 Type of visit - description : cpx Interval history: No major concerns   Review of Systems  A 14 point review of systems is negative    Past Medical History:  Diagnosis Date  . Diabetes-- A1c 6.7 (2016) 05/15/2015  . Leiomyosarcoma ([place]) 05/20/2015  . Lipoma of arm 2016    Past Surgical History:  Procedure Laterality Date  . INGUINAL HERNIA REPAIR  2000   unilateral  . MASS EXCISION Left 08/04/2015   Procedure: EXCISION OF MASS ON LEFT ARM;  Surgeon: [name], MD;  Location: [facility];  Service: General;  Laterality: Left;    Social History   Social History  . Marital status: Married    Spouse name: N/A  . Number of children: 4  . Years of education: N/A   Occupational History  . [place] [employer] [place], [place]     Social History Main Topics  . Smoking status: Never Smoker  . Smokeless tobacco: Never Used  . Alcohol use 0.0 oz/week     Comment: rarely  . Drug use: No  . Sexual activity: Not on file   Other Topics Concern  . Not on file   Social History Narrative   Occupation: works for the [employer] (not sedentary)    lost wife 06-2014 , breast cancer   2 children  2001, 2003--- live w/ him           Family History  Problem Relation Age of Onset  . Hypertension Mother   . Diabetes Mother   . Colon cancer Father        <60  . Heart attack Neg Hx   . Prostate cancer Neg Hx      Allergies as of 06/16/2017   No Known Allergies     Medication List       Accurate as of 06/16/17 11:59 PM. Always use your most recent med list.          aspirin 81 MG tablet Take 81 mg by mouth daily.            Discharge Care Instructions        Start     Ordered   06/16/17 0000  Comp Met (CMET)     06/16/17 1551   06/16/17 0000  Lipid panel     06/16/17 1551   06/16/17 0000  CBC w/Diff     06/16/17 1551   06/16/17 0000  Hemoglobin  A1c     06/16/17 1551   06/16/17 0000  Urine Microalbumin w/creat. ratio     06/16/17 1551   06/16/17 0000  TSH     06/16/17 1551   06/16/17 0000  PSA     06/16/17 1551   06/16/17 0000  Ambulatory referral to Gastroenterology    Question:  What is the reason for referral?  Answer:  Colonoscopy   06/16/17 1552   06/16/17 0000  Ambulatory referral to General Surgery    Question:  What is the reason for referral?  Answer:  Other  Comment:  leiomyosarcoma   06/16/17 1558         Objective:   Physical Exam  Skin:      BP 128/80 (BP Location: Left Arm, Patient Position: Sitting, Cuff Size: Normal)   Pulse (!) 56   Temp 98.6 F (37 C) (Oral)  Resp 14   Ht '6\' 1"'  (1.854 m)   Wt 291 lb 6 oz (132.2 kg)   SpO2 98%   BMI 38.44 kg/m   General:   Well developed, well nourished . NAD.  Neck: No  thyromegaly  HEENT:  Normocephalic . Face symmetric, atraumatic Lungs:  CTA B Normal respiratory effort, no intercostal retractions, no accessory muscle use. Heart: RRR,  no murmur.  No pretibial edema bilaterally  Abdomen:  Not distended, soft, non-tender. No rebound or rigidity.   Skin:  Left arm: Area of previous surgery seems well-healed. Left leg: See graphic Rectal:  External abnormalities: none. Normal sphincter tone. No rectal masses or tenderness.  No stools found Prostate: Exam quite limited due to patient habitus, I was barely able to palpate the prostate but  seemd normal.  Neurologic:  alert & oriented X3.  Speech normal, gait appropriate for age and unassisted Strength symmetric and appropriate for age.  Psych: Cognition and judgment appear intact.  Cooperative with normal attention span and concentration.  Behavior appropriate. No anxious or depressed appearing.    Assessment & Plan:   Assessment Diabetes, A1c 6.7 2006 Hyperlipidemia  Leiomyosarcoma, L arm  S/p excision 07-2015 , saw Dr Valere Dross, rec wider excision, pt d/w surgery and declined    PLAN: DM:  Diet control, checking an A1c and micral Hyperlipidemia: Diet control, checking labs Leiomyosarcoma: Overdue to see surgery, again told patient that he is high risk for recurrence,  will arrange a referral. He also has a skin lesion at the left leg, recommend to discuss possible excision with surgery although the lesion has not changed years according to the patient.  RTC one year

## 2017-06-17 LAB — LIPID PANEL
CHOL/HDL RATIO: 4.3 (calc) (ref <5.0)
CHOLESTEROL: 217 mg/dL — AB (ref <200)
HDL: 50 mg/dL (ref >40)
LDL Cholesterol (Calc): 138 mg/dL (calc) — ABNORMAL HIGH
Non-HDL Cholesterol (Calc): 167 mg/dL (calc) — ABNORMAL HIGH (ref <130)
TRIGLYCERIDES: 154 mg/dL — AB (ref <150)

## 2017-06-17 LAB — COMPREHENSIVE METABOLIC PANEL
AG Ratio: 1.4 (calc) (ref 1.0–2.5)
ALBUMIN MSPROF: 4.3 g/dL (ref 3.6–5.1)
ALKALINE PHOSPHATASE (APISO): 47 U/L (ref 40–115)
ALT: 19 U/L (ref 9–46)
AST: 16 U/L (ref 10–35)
BILIRUBIN TOTAL: 0.9 mg/dL (ref 0.2–1.2)
BUN: 14 mg/dL (ref 7–25)
CALCIUM: 9.2 mg/dL (ref 8.6–10.3)
CO2: 22 mmol/L (ref 20–32)
CREATININE: 1.08 mg/dL (ref 0.70–1.33)
Chloride: 106 mmol/L (ref 98–110)
Globulin: 3.1 g/dL (calc) (ref 1.9–3.7)
Glucose, Bld: 92 mg/dL (ref 65–99)
POTASSIUM: 4.2 mmol/L (ref 3.5–5.3)
Sodium: 138 mmol/L (ref 135–146)
Total Protein: 7.4 g/dL (ref 6.1–8.1)

## 2017-06-17 LAB — CBC WITH DIFFERENTIAL/PLATELET
BASOS ABS: 28 {cells}/uL (ref 0–200)
BASOS PCT: 0.5 %
EOS ABS: 132 {cells}/uL (ref 15–500)
Eosinophils Relative: 2.4 %
HCT: 42.1 % (ref 38.5–50.0)
Hemoglobin: 14 g/dL (ref 13.2–17.1)
Lymphs Abs: 1782 cells/uL (ref 850–3900)
MCH: 26.7 pg — AB (ref 27.0–33.0)
MCHC: 33.3 g/dL (ref 32.0–36.0)
MCV: 80.2 fL (ref 80.0–100.0)
MONOS PCT: 7.5 %
MPV: 10.1 fL (ref 7.5–12.5)
Neutro Abs: 3146 cells/uL (ref 1500–7800)
Neutrophils Relative %: 57.2 %
Platelets: 274 10*3/uL (ref 140–400)
RBC: 5.25 10*6/uL (ref 4.20–5.80)
RDW: 13.9 % (ref 11.0–15.0)
TOTAL LYMPHOCYTE: 32.4 %
WBC: 5.5 10*3/uL (ref 3.8–10.8)
WBCMIX: 413 {cells}/uL (ref 200–950)

## 2017-06-17 LAB — MICROALBUMIN / CREATININE URINE RATIO
Creatinine, Urine: 46 mg/dL (ref 20–370)
Microalb Creat Ratio: 22 mcg/mg creat (ref <30)
Microalb, Ur: 1 mg/dL

## 2017-06-17 LAB — HEMOGLOBIN A1C
HEMOGLOBIN A1C: 6 %{Hb} — AB (ref <5.7)
MEAN PLASMA GLUCOSE: 126 (calc)
eAG (mmol/L): 7 (calc)

## 2017-06-17 LAB — TSH: TSH: 0.48 mIU/L (ref 0.40–4.50)

## 2017-06-17 LAB — PSA: PSA: 0.5 ng/mL (ref <=4.0)

## 2017-06-18 NOTE — Assessment & Plan Note (Signed)
DM: Diet control, checking an A1c and micral Hyperlipidemia: Diet control, checking labs Leiomyosarcoma: Overdue to see surgery, again told patient that he is high risk for recurrence,  will arrange a referral. He also has a skin lesion at the left leg, recommend to discuss possible excision with surgery although the lesion has not changed years according to the patient.  RTC one year

## 2017-06-26 DIAGNOSIS — R2232 Localized swelling, mass and lump, left upper limb: Secondary | ICD-10-CM | POA: Diagnosis not present

## 2017-08-17 ENCOUNTER — Encounter: Payer: Self-pay | Admitting: Internal Medicine

## 2017-10-17 DIAGNOSIS — Z23 Encounter for immunization: Secondary | ICD-10-CM | POA: Diagnosis not present

## 2017-10-17 DIAGNOSIS — Z00129 Encounter for routine child health examination without abnormal findings: Secondary | ICD-10-CM | POA: Diagnosis not present

## 2017-10-18 ENCOUNTER — Ambulatory Visit
Admission: RE | Admit: 2017-10-18 | Discharge: 2017-10-18 | Disposition: A | Discharge status: Home / Self Care | Payer: Worker's Compensation | Source: Ambulatory Visit | Attending: Nurse Practitioner | Admitting: Nurse Practitioner

## 2017-10-18 ENCOUNTER — Other Ambulatory Visit: Payer: Self-pay | Admitting: Nurse Practitioner

## 2017-10-18 DIAGNOSIS — M25512 Pain in left shoulder: Secondary | ICD-10-CM

## 2018-06-15 ENCOUNTER — Ambulatory Visit (INDEPENDENT_AMBULATORY_CARE_PROVIDER_SITE_OTHER): Payer: 59 | Admitting: Podiatry

## 2018-06-15 ENCOUNTER — Encounter: Payer: Self-pay | Admitting: Podiatry

## 2018-06-15 DIAGNOSIS — L97501 Non-pressure chronic ulcer of other part of unspecified foot limited to breakdown of skin: Secondary | ICD-10-CM

## 2018-06-15 DIAGNOSIS — L988 Other specified disorders of the skin and subcutaneous tissue: Secondary | ICD-10-CM | POA: Diagnosis not present

## 2018-06-15 DIAGNOSIS — B353 Tinea pedis: Secondary | ICD-10-CM

## 2018-06-15 MED ORDER — TERBINAFINE HCL 250 MG PO TABS: 250.0000 mg | ORAL_TABLET | Freq: Every day | ORAL | 0 refills | Status: DC

## 2018-06-15 MED ORDER — CASTELLANI PAINT 1.5 % EX LIQD: 1.0000 "application " | Freq: Every day | CUTANEOUS | 0 refills | Status: DC

## 2018-06-15 MED ORDER — CEPHALEXIN 500 MG PO CAPS: 500.0000 mg | ORAL_CAPSULE | Freq: Three times a day (TID) | ORAL | 0 refills | Status: DC

## 2018-06-16 NOTE — Progress Notes (Signed)
Subjective: 62 year old male presents the office today with concerns of a rash, fungus between his toes on his left foot which is been ongoing since May 2018 is been getting worse.  I saw him for similar issue last year.  He states that his feet sweat a lot at work.  He has to wear steel toe boots.  He did get some yellow drainage coming from between his toes.  Occasionally has some anger to his toes and feet from the sweat.  He has no other concerns.  No recent treatment. Denies any systemic complaints such as fevers, chills, nausea, vomiting. No acute changes since last appointment, and no other complaints at this time.   Objective: AAO x3, NAD DP/PT pulses palpable bilaterally, CRT less than 3 seconds Interdigitally along the third and fourth interspace and mildly to the second interspace are superficial wounds with epidermal lysis present.  It appears that he had some macerated tissue in the interspaces and the skin is peeled off.  Unable to identify any areas of drainage or pus today.  There is no fluctuation or crepitation.  There is no probing, undermining or tunneling there is a superficial granular appearing wounds.  No open lesions or pre-ulcerative lesions.  No pain with calf compression, swelling, warmth, erythema  Assessment: Left interdigital ulcerations likely from maceration, tinea pedis  Plan: -All treatment options discussed with the patient including all alternatives, risks, complications.  -Discussed various treatment options.  I prescribed a two-week course of Lamisil as well as Keflex.  Regarding his His Pain Complaint Interdigitally.  Dry Thoroughly between Times.  Once We Get This Healed We Discussed Various Measures in Order to Prevent This from Happening Again. -Patient encouraged to call the office with any questions, concerns, change in symptoms.    Trula Slade DPM

## 2018-06-22 DIAGNOSIS — E119 Type 2 diabetes mellitus without complications: Secondary | ICD-10-CM | POA: Diagnosis not present

## 2018-06-22 DIAGNOSIS — H2513 Age-related nuclear cataract, bilateral: Secondary | ICD-10-CM | POA: Diagnosis not present

## 2018-06-22 LAB — HM DIABETES EYE EXAM

## 2018-06-27 ENCOUNTER — Encounter: Payer: Self-pay | Admitting: Internal Medicine

## 2018-06-29 ENCOUNTER — Encounter: Payer: Self-pay | Admitting: Internal Medicine

## 2018-06-29 ENCOUNTER — Ambulatory Visit (INDEPENDENT_AMBULATORY_CARE_PROVIDER_SITE_OTHER): Payer: 59 | Admitting: Internal Medicine

## 2018-06-29 VITALS — BP 136/78 | HR 51 | Temp 98.1°F | Resp 16 | Ht 73.0 in | Wt 289.0 lb

## 2018-06-29 DIAGNOSIS — Z0000 Encounter for general adult medical examination without abnormal findings: Secondary | ICD-10-CM | POA: Diagnosis not present

## 2018-06-29 DIAGNOSIS — E119 Type 2 diabetes mellitus without complications: Secondary | ICD-10-CM

## 2018-06-29 LAB — CBC WITH DIFFERENTIAL/PLATELET
BASOS PCT: 0.6 % (ref 0.0–3.0)
Basophils Absolute: 0 10*3/uL (ref 0.0–0.1)
EOS ABS: 0.1 10*3/uL (ref 0.0–0.7)
Eosinophils Relative: 2 % (ref 0.0–5.0)
HEMATOCRIT: 42.9 % (ref 39.0–52.0)
HEMOGLOBIN: 14.1 g/dL (ref 13.0–17.0)
LYMPHS PCT: 27.1 % (ref 12.0–46.0)
Lymphs Abs: 1.4 10*3/uL (ref 0.7–4.0)
MCHC: 32.7 g/dL (ref 30.0–36.0)
MCV: 81.8 fl (ref 78.0–100.0)
MONO ABS: 0.3 10*3/uL (ref 0.1–1.0)
Monocytes Relative: 6.1 % (ref 3.0–12.0)
Neutro Abs: 3.4 10*3/uL (ref 1.4–7.7)
Neutrophils Relative %: 64.2 % (ref 43.0–77.0)
Platelets: 262 10*3/uL (ref 150.0–400.0)
RBC: 5.24 Mil/uL (ref 4.22–5.81)
RDW: 15.6 % — AB (ref 11.5–15.5)
WBC: 5.3 10*3/uL (ref 4.0–10.5)

## 2018-06-29 LAB — MICROALBUMIN / CREATININE URINE RATIO
CREATININE, U: 131.8 mg/dL
MICROALB UR: 12 mg/dL — AB (ref 0.0–1.9)
MICROALB/CREAT RATIO: 9.1 mg/g (ref 0.0–30.0)

## 2018-06-29 LAB — COMPREHENSIVE METABOLIC PANEL
ALBUMIN: 4.1 g/dL (ref 3.5–5.2)
ALK PHOS: 41 U/L (ref 39–117)
ALT: 17 U/L (ref 0–53)
AST: 14 U/L (ref 0–37)
BUN: 13 mg/dL (ref 6–23)
CHLORIDE: 107 meq/L (ref 96–112)
CO2: 30 mEq/L (ref 19–32)
CREATININE: 1.07 mg/dL (ref 0.40–1.50)
Calcium: 8.8 mg/dL (ref 8.4–10.5)
GFR: 90.62 mL/min (ref >60.00)
Glucose, Bld: 91 mg/dL (ref 70–99)
Potassium: 3.8 mEq/L (ref 3.5–5.1)
SODIUM: 142 meq/L (ref 135–145)
TOTAL PROTEIN: 6.8 g/dL (ref 6.0–8.3)
Total Bilirubin: 0.8 mg/dL (ref 0.2–1.2)

## 2018-06-29 LAB — LIPID PANEL
CHOLESTEROL: 185 mg/dL (ref 0–200)
HDL: 43.6 mg/dL (ref >39.00)
LDL CALC: 109 mg/dL — AB (ref 0–99)
NONHDL: 141.02
Total CHOL/HDL Ratio: 4
Triglycerides: 158 mg/dL — ABNORMAL HIGH (ref 0.0–149.0)
VLDL: 31.6 mg/dL (ref 0.0–40.0)

## 2018-06-29 LAB — HEMOGLOBIN A1C: HEMOGLOBIN A1C: 6.4 % (ref 4.6–6.5)

## 2018-06-29 MED ORDER — KETOCONAZOLE 2 % EX CREA: 1.0000 "application " | TOPICAL_CREAM | Freq: Two times a day (BID) | CUTANEOUS | 0 refills | Status: DC

## 2018-06-29 MED ORDER — TERBINAFINE HCL 250 MG PO TABS: 250.0000 mg | ORAL_TABLET | Freq: Every day | ORAL | 0 refills | Status: DC

## 2018-06-29 NOTE — Assessment & Plan Note (Signed)
-  Td 01-2009; pnm 23: 2016;  declined a flu shot. -CCS: +FH Cscope 2006 (-) no polyps, second Cscope 07-2010 (-); failed  GI referral last year, declined GI referral. -prostate ca screening: DRE -PSA wnl 2018 -Diet and exercise discussed   -Labs: CMP, FLP, CBC, A1c, micro.

## 2018-06-29 NOTE — Progress Notes (Signed)
Subjective:    Patient ID: Gary [name], male    DOB: 06-Jul-1956, 62 y.o.   MRN: [id number]  DOS:  06/29/2018 Type of visit - description : cpx Interval history: Feeling well. Having issues with shoulder pain but he is addressing sxs elsewhere. Was treated by Dr. Jacqualyn Posey for tinea pedis, improved but not completely well.  Review of Systems  Other than above, a 14 point review of systems is negative    Past Medical History:  Diagnosis Date  . Diabetes-- A1c 6.7 (2016) 05/15/2015  . 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 Lipoma of arm 2016    Past Surgical History:  Procedure Laterality Date  . INGUINAL HERNIA REPAIR  2000   unilateral  . MASS EXCISION Left 08/04/2015   Procedure: EXCISION OF MASS ON LEFT ARM;  Surgeon: [name], MD;  Location: [facility];  Service: General;  Laterality: Left;    Social History   Socioeconomic History  . Marital status: Married    Spouse name: Not on file  . Number of children: 4  . Years of education: Not on file  . Highest education level: Not on file  Occupational History  . Occupation: city of [place], [place]  . Financial resource strain: Not on file  . Food insecurity:    Worry: Not on file    Inability: Not on file  . Transportation needs:    Medical: Not on file    Non-medical: Not on file  Tobacco Use  . Smoking status: Never Smoker  . Smokeless tobacco: Never Used  Substance and Sexual Activity  . Alcohol use: Yes    Alcohol/week: 0.0 standard drinks    Comment: rarely  . Drug use: No  . Sexual activity: Not on file  Lifestyle  . Physical activity:    Days per week: Not on file    Minutes per session: Not on file  . Stress: Not on file  Relationships  . Social connections:    Talks on phone: Not on file    Gets together: Not on file    Attends religious service: Not on file    Active member of club or organization: Not on file    Attends meetings of clubs or organizations: Not on file     Relationship status: Not on file  . Intimate partner violence:    Fear of current or ex partner: Not on file    Emotionally abused: Not on file    Physically abused: Not on file    Forced sexual activity: Not on file  Other Topics Concern  . Not on file  Social History Narrative   Occupation: works for the [employer] (not sedentary)    lost wife 06-2014 , breast cancer   2 children  2001, 2003--- live w/ him        Family History  Problem Relation Age of Onset  . Hypertension Mother   . Diabetes Mother   . Colon cancer Father        <60  . Heart attack Neg Hx   . Prostate cancer Neg Hx      Allergies as of 06/29/2018   No Known Allergies     Medication List        Accurate as of 06/29/18 11:59 PM. Always use your most recent med list.          ALEVE 220 MG Caps Generic drug:  Naproxen Sodium Aleve   aspirin  81 MG tablet Take 81 mg by mouth daily.   ketoconazole 2 % cream Commonly known as:  NIZORAL Apply 1 application topically 2 (two) times daily.          Objective:   Physical Exam BP 136/78 (BP Location: Left Arm, Patient Position: Sitting, Cuff Size: Normal)   Pulse (!) 51   Temp 98.1 F (36.7 C) (Oral)   Resp 16   Ht 6\' 1"  (1.854 m)   Wt 289 lb (131.1 kg)   SpO2 97%   BMI 38.13 kg/m  General: Well developed, NAD, see BMI.  Neck: No  thyromegaly  HEENT:  Normocephalic . Face symmetric, atraumatic Lungs:  CTA B Normal respiratory effort, no intercostal retractions, no accessory muscle use. Heart: RRR,  no murmur.  No pretibial edema bilaterally  Abdomen:  Not distended, soft, non-tender. No rebound or rigidity.   Skin: Exposed areas without rash. Not pale. Not jaundice DIABETIC FEET EXAM: No lower extremity edema Normal pedal pulses bilaterally Skin normal: + Maceration mostly between the third fourth and fifth left toes.  No evidence on cellulitis. Pinprick examination of the feet normal. Neurologic:  alert & oriented X3.    Speech normal, gait appropriate for age and unassisted Strength symmetric and appropriate for age.  Psych: Cognition and judgment appear intact.  Cooperative with normal attention span and concentration.  Behavior appropriate. No anxious or depressed appearing.     Assessment & Plan:   Assessment Diabetes, A1c 6.7 2006 Hyperlipidemia  Leiomyosarcoma, L arm  S/p excision 07-2015 , saw Dr Valere Dross, rec wider excision, pt d/w surgery and declined    PLAN: DM: Diet controlled, check a A1c. Hyperlipidemia: Last LDL 138, was recommended Lipitor, declined. Explained his LDL goal is 100, higer levels increases risk of strokes and heart attacks.  He remains reluctant to take medication. Leiomyosarcoma: See previous entries. Tinea pedis: Status post 2 weeks of oral medication, improved but not better, recommend to use talc/powder to keep the area dry, reports a lot of sweating at work.  Also recommend Nizoral cream.  Call if not better. RTC 1 year

## 2018-06-29 NOTE — Progress Notes (Signed)
Pre visit review using our clinic review tool, if applicable. No additional management support is needed unless otherwise documented below in the visit note. 

## 2018-06-29 NOTE — Patient Instructions (Addendum)
GO TO THE LAB : Get the blood work     GO TO THE FRONT DESK Schedule your next appointment for a physical exam in 1 year    Use powder on your feet consistently when you go to work Use the cream ketoconazole twice a day for 2 weeks If there is skin if it is not back to normal please call me or Dr Jacqualyn Posey

## 2018-07-01 NOTE — Assessment & Plan Note (Signed)
DM: Diet controlled, check a A1c. Hyperlipidemia: Last LDL 138, was recommended Lipitor, declined. Explained his LDL goal is 100, higer levels increases risk of strokes and heart attacks.  He remains reluctant to take medication. Leiomyosarcoma: See previous entries. Tinea pedis: Status post 2 weeks of oral medication, improved but not better, recommend to use talc/powder to keep the area dry, reports a lot of sweating at work.  Also recommend Nizoral cream.  Call if not better. RTC 1 year

## 2018-07-02 ENCOUNTER — Ambulatory Visit: Payer: 59 | Admitting: Podiatry

## 2018-07-12 ENCOUNTER — Ambulatory Visit: Payer: 59 | Admitting: Psychology

## 2018-07-12 ENCOUNTER — Ambulatory Visit: Payer: 59 | Admitting: Podiatry

## 2018-07-12 DIAGNOSIS — F321 Major depressive disorder, single episode, moderate: Secondary | ICD-10-CM

## 2018-07-20 ENCOUNTER — Ambulatory Visit: Payer: 59 | Admitting: Psychology

## 2018-07-31 ENCOUNTER — Ambulatory Visit: Payer: 59 | Admitting: Psychology

## 2018-07-31 DIAGNOSIS — F321 Major depressive disorder, single episode, moderate: Secondary | ICD-10-CM

## 2018-08-28 ENCOUNTER — Ambulatory Visit: Payer: 59 | Admitting: Psychology

## 2018-08-29 ENCOUNTER — Encounter: Payer: Self-pay | Admitting: Internal Medicine

## 2018-08-29 ENCOUNTER — Ambulatory Visit: Payer: 59 | Admitting: Internal Medicine

## 2018-08-29 VITALS — BP 130/88 | HR 60 | Temp 98.0°F | Resp 18 | Ht 73.0 in | Wt 287.0 lb

## 2018-08-29 DIAGNOSIS — R0683 Snoring: Secondary | ICD-10-CM

## 2018-08-29 DIAGNOSIS — Z0189 Encounter for other specified special examinations: Secondary | ICD-10-CM | POA: Diagnosis not present

## 2018-08-29 DIAGNOSIS — N529 Male erectile dysfunction, unspecified: Secondary | ICD-10-CM | POA: Diagnosis not present

## 2018-08-29 MED ORDER — SILDENAFIL CITRATE 20 MG PO TABS: 60.0000 mg | ORAL_TABLET | Freq: Every evening | ORAL | 3 refills | Status: DC | PRN

## 2018-08-29 NOTE — Patient Instructions (Signed)
Will refer you to neurology regards snoring  Try sildenafil, the generic Viagra.  Follow the instruction on the prescription. Watch for side effects.

## 2018-08-29 NOTE — Progress Notes (Signed)
Pre visit review using our clinic review tool, if applicable. No additional management support is needed unless otherwise documented below in the visit note. 

## 2018-08-29 NOTE — Progress Notes (Signed)
Subjective:    Patient ID: Gary [name], male    DOB: [birth year], 62 y.o.   MRN: [id number]  DOS:  08/29/2018 Type of visit - description : acute Concern about his sleep apnea. Wife has noted very loud snoring associated with episodes of apnea. When asked, the patient admits that he is tired all the time, "I think is because I work all the time". He is somewhat sleepy.  Also, having occasional problems with erections, prescription?  No history of CAD, has never tried such medications.   Review of Systems Denies chest pain no difficulty breathing Does not drink alcohol  Past Medical History:  Diagnosis Date  . Diabetes-- A1c 6.7 (2016) 05/15/2015  . Leiomyosarcoma ([facility]) 05/20/2015  . Lipoma of arm 2016    Past Surgical History:  Procedure Laterality Date  . INGUINAL HERNIA REPAIR  2000   unilateral  . MASS EXCISION Left 08/04/2015   Procedure: EXCISION OF MASS ON LEFT ARM;  Surgeon: [name], MD;  Location: [facility];  Service: General;  Laterality: Left;    Social History   Socioeconomic History  . Marital status: Married    Spouse name: Not on file  . Number of children: 4  . Years of education: Not on file  . Highest education level: Not on file  Occupational History  . Occupation: city of [place], [place]  . Financial resource strain: Not on file  . Food insecurity:    Worry: Not on file    Inability: Not on file  . Transportation needs:    Medical: Not on file    Non-medical: Not on file  Tobacco Use  . Smoking status: Never Smoker  . Smokeless tobacco: Never Used  Substance and Sexual Activity  . Alcohol use: Not Currently    Alcohol/week: 0.0 standard drinks    Comment: rarely  . Drug use: No  . Sexual activity: Not on file  Lifestyle  . Physical activity:    Days per week: Not on file    Minutes per session: Not on file  . Stress: Not on file  Relationships  . Social connections:    Talks on phone: Not on file   Gets together: Not on file    Attends religious service: Not on file    Active member of club or organization: Not on file    Attends meetings of clubs or organizations: Not on file    Relationship status: Not on file  . Intimate partner violence:    Fear of current or ex partner: Not on file    Emotionally abused: Not on file    Physically abused: Not on file    Forced sexual activity: Not on file  Other Topics Concern  . Not on file  Social History Narrative   Occupation: works for the [employer] (not sedentary)    lost wife 06-2014 , breast cancer   2 children  2001, 2003--- live w/ him         Allergies as of 08/29/2018   No Known Allergies     Medication List        Accurate as of 08/29/18  6:20 PM. Always use your most recent med list.          ALEVE 220 MG Caps Generic drug:  Naproxen Sodium Aleve   aspirin 81 MG tablet Take 81 mg by mouth daily.   sildenafil 20 MG tablet Commonly known as:  REVATIO  Take 3-4 tablets (60-80 mg total) by mouth at bedtime as needed.           Objective:   Physical Exam BP 130/88 (BP Location: Left Arm, Patient Position: Sitting, Cuff Size: Large)   Pulse 60   Temp 98 F (36.7 C) (Oral)   Resp 18   Ht 6\' 1"  (1.854 m)   Wt 287 lb (130.2 kg)   SpO2 99%   BMI 37.87 kg/m  General:   Well developed, NAD, BMI noted. HEENT:  Normocephalic . Face symmetric, atraumatic Throat is not particularly crowded, neck is short. Lungs:  CTA B Normal respiratory effort, no intercostal retractions, no accessory muscle use. Heart: RRR,  no murmur.  No pretibial edema bilaterally  Skin: Not pale. Not jaundice Neurologic:  alert & oriented X3.  Speech normal, gait appropriate for age and unassisted Psych--  Cognition and judgment appear intact.  Cooperative with normal attention span and concentration.  Behavior appropriate. No anxious or depressed appearing.      Assessment & Plan:   Assessment Diabetes, A1c 6.7  2006 Hyperlipidemia  Leiomyosarcoma, L arm  S/p excision 07-2015 , saw Dr Valere Dross, rec wider excision, pt d/w surgery and declined    PLAN: Snoring: Patient concerned about having  sleep apnea.  His BMI is 37, he has witnessed  snoring and apneas.  Epworth scale is only 8.  Overall, I think he is at risk for OSA so  will refer to neurology for further eval. ED: New issue, likes to try a medication, sildenafil prescribed, side effects and how to use it discussed. Declined flu shot

## 2018-08-29 NOTE — Assessment & Plan Note (Signed)
Snoring: Patient concerned about having  sleep apnea.  His BMI is 37, he has witnessed  snoring and apneas.  Epworth scale is only 8.  Overall, I think he is at risk for OSA so  will refer to neurology for further eval. ED: New issue, likes to try a medication, sildenafil prescribed, side effects and how to use it discussed. Declined flu shot

## 2018-10-26 DIAGNOSIS — R972 Elevated prostate specific antigen [PSA]: Secondary | ICD-10-CM | POA: Diagnosis not present

## 2018-10-26 DIAGNOSIS — E291 Testicular hypofunction: Secondary | ICD-10-CM | POA: Diagnosis not present

## 2018-11-01 ENCOUNTER — Encounter: Payer: Self-pay | Admitting: Neurology

## 2018-11-01 ENCOUNTER — Ambulatory Visit: Payer: 59 | Admitting: Neurology

## 2018-11-01 VITALS — BP 136/82 | HR 59 | Ht 73.0 in | Wt 282.0 lb

## 2018-11-01 DIAGNOSIS — R351 Nocturia: Secondary | ICD-10-CM

## 2018-11-01 DIAGNOSIS — E669 Obesity, unspecified: Secondary | ICD-10-CM

## 2018-11-01 DIAGNOSIS — G4719 Other hypersomnia: Secondary | ICD-10-CM

## 2018-11-01 DIAGNOSIS — R0683 Snoring: Secondary | ICD-10-CM

## 2018-11-01 DIAGNOSIS — Z9189 Other specified personal risk factors, not elsewhere classified: Secondary | ICD-10-CM | POA: Diagnosis not present

## 2018-11-01 DIAGNOSIS — R0681 Apnea, not elsewhere classified: Secondary | ICD-10-CM

## 2018-11-01 NOTE — Progress Notes (Signed)
Subjective:    Patient ID: Gary [name] is a 63 y.o. male.  HPI     Star Age, MD, PhD [facility] [address] [place], [place] [address]  Dear Dr. Larose Kells,   I saw your patient, Gary [name], upon your kind request, in my sleep clinic today for initial consultation of his sleep disorder, in particular, concern for underlying obstructive sleep apnea. The patient is unaccompanied today. As you know, [name] is a 63 year old right-handed gentleman with an underlying medical history of diabetes, left arm tumor with status post excision, and obesity, who reports snoring and excessive daytime somnolence as well as witnessed apneas. I reviewed your office note from 08/29/2016. His Epworth sleepiness score is 12 out of 24 today, fatigue score is 29 out of 63. He reports that his snoring can be loud. He works for the city of [place]. He lives with his children, ages 35 and 18. He is separated. He is a nonsmoker and drinks alcohol infrequently, drinks caffeine very occasionally. He reports that he does not get enough sleep and that he works a lot. He works full-time for [employer] in [employer]. 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 On Wednesdays and weekends he works part time in [place].  He is not aware of any family history of OSA. He is trying to lose weight. He has lost over 10 lb already.  His bedtime and rise time vary because of his work schedule. He tries to be in bed around 10:30 but when he works late he may not be in bed until 1 AM. He has to wake up around 5 almost every day. He denies morning headaches but does have nocturia on average twice. He does not watch TV in his bedroom. He has no pets in the household.  His Past Medical History Is Significant For: Past Medical History:  Diagnosis Date  . Diabetes-- A1c 6.7 (2016) 05/15/2015  . Leiomyosarcoma ([facility]) 05/20/2015  . Lipoma of arm 2016     His Past Surgical History Is Significant For: Past Surgical History:  Procedure Laterality Date  . INGUINAL HERNIA REPAIR  2000   unilateral  . MASS EXCISION Left 08/04/2015   Procedure: EXCISION OF MASS ON LEFT ARM;  Surgeon: [name], MD;  Location: [facility];  Service: General;  Laterality: Left;    His Family History Is Significant For: Family History  Problem Relation Age of Onset  . Hypertension Mother   . Diabetes Mother   . Colon cancer Father        <60  . Heart attack Neg Hx   . Prostate cancer Neg Hx     His Social History Is Significant For: Social History   Socioeconomic History  . Marital status: Married    Spouse name: Not on file  . Number of children: 4  . Years of education: Not on file  . Highest education level: Not on file  Occupational History  . Occupation: city of [place], [place]  . Financial resource strain: Not on file  . Food insecurity:    Worry: Not on file    Inability: Not on file  . Transportation needs:    Medical: Not on file    Non-medical: Not on file  Tobacco Use  . Smoking status: Never Smoker  . Smokeless tobacco: Never Used  Substance and Sexual Activity  . Alcohol use:  Not Currently    Alcohol/week: 0.0 standard drinks    Comment: rarely  . Drug use: No  . Sexual activity: Not on file  Lifestyle  . Physical activity:    Days per week: Not on file    Minutes per session: Not on file  . Stress: Not on file  Relationships  . Social connections:    Talks on phone: Not on file    Gets together: Not on file    Attends religious service: Not on file    Active member of club or organization: Not on file    Attends meetings of clubs or organizations: Not on file    Relationship status: Not on file  Other Topics Concern  . Not on file  Social History Narrative   Occupation: works for the [employer] (not sedentary)    lost wife 06-2014 , breast cancer   2 children  2001, 2003--- live  w/ him       His Allergies Are:  No Known Allergies:   His Current Medications Are:  Outpatient Encounter Medications as of 11/01/2018  Medication Sig  . Naproxen Sodium (ALEVE) 220 MG CAPS Aleve  . sildenafil (REVATIO) 20 MG tablet Take 3-4 tablets (60-80 mg total) by mouth at bedtime as needed.  Marland Kitchen aspirin 81 MG tablet Take 81 mg by mouth daily.   No facility-administered encounter medications on file as of 11/01/2018.   :  Review of Systems:  Out of a complete 14 point review of systems, all are reviewed and negative with the exception of these symptoms as listed below: Review of Systems  Neurological:       Pt presents today to discuss his sleep. Pt has never had a sleep study but does endorse snoring.  Epworth Sleepiness Scale 0= would never doze 1= slight chance of dozing 2= moderate chance of dozing 3= high chance of dozing  Sitting and reading: 3 Watching TV: 2 Sitting inactive in a public place (ex. Theater or meeting): 1 As a passenger in a car for an hour without a break: 2 Lying down to rest in the afternoon: 2 Sitting and talking to someone: 0 Sitting quietly after lunch (no alcohol): 1 In a car, while stopped in traffic: 1 Total: 12     Objective:  Neurological Exam  Physical Exam Physical Examination:   Vitals:   11/01/18 1541  BP: 136/82  Pulse: (!) 59    General Examination: The patient is a very pleasant 63 y.o. male in no acute distress. He appears well-developed and well-nourished and well groomed.   HEENT: Normocephalic, atraumatic, pupils are equal, round and reactive to light and accommodation. Corrective eyeglasses in place. Extraocular tracking is good without limitation to gaze excursion or nystagmus noted. Normal smooth pursuit is noted. Hearing is grossly intact. Face is symmetric with normal facial animation and normal facial sensation. Speech is clear with no dysarthria noted. There is no hypophonia. There is no lip, neck/head, jaw or  voice tremor. Neck is supple with full range of passive and active motion. There are no carotid bruits on auscultation. Oropharynx exam reveals: mild mouth dryness, adequate dental hygiene and moderate airway crowding, due to thicker soft palate, wider tongue base, tonsils in place, about 1-2+ bilaterally. Mallampati is class II. Tongue protrudes centrally and palate elevates symmetrically. Neck circumference is 18-1/2 inches, he has a minimal overbite.  Chest: Clear to auscultation without wheezing, rhonchi or crackles noted.  Heart: S1+S2+0, regular and normal without murmurs,  rubs or gallops noted.   Abdomen: Soft, non-tender and non-distended with normal bowel sounds appreciated on auscultation.  Extremities: There is no pitting edema in the distal lower extremities bilaterally.   Skin: Warm and dry without trophic changes noted.  Musculoskeletal: exam reveals no obvious joint deformities, tenderness or joint swelling or erythema.   Neurologically:  Mental status: The patient is awake, alert and oriented in all 4 spheres. His immediate and remote memory, attention, language skills and fund of knowledge are appropriate. There is no evidence of aphasia, agnosia, apraxia or anomia. Speech is clear with normal prosody and enunciation. Thought process is linear. Mood is normal and affect is normal.  Cranial nerves II - XII are as described above under HEENT exam. In addition: shoulder shrug is normal with equal shoulder height noted. Motor exam: Normal bulk, strength and tone is noted. There is no tremor. Romberg is negative. Fine motor skills and coordination: grossly intact.  Cerebellar testing: No dysmetria or intention tremor. There is no truncal or gait ataxia.  Sensory exam: intact to light touch.  Gait, station and balance: He stands easily. No veering to one side is noted. No leaning to one side is noted. Posture is age-appropriate and stance is narrow based. Gait shows normal stride  length and normal pace. No problems turning are noted. Tandem walk is unremarkable.   Assessment and Plan:  In summary, ATHONY COPPA is a very pleasant 63 y.o.-year old male with an underlying medical history of diabetes, left arm tumor with status post excision, and obesity, whose history and physical exam are concerning for obstructive sleep apnea (OSA). I had a long chat with the patient about my findings and the diagnosis of OSA, its prognosis and treatment options. We talked about medical treatments, surgical interventions and non-pharmacological approaches. I explained in particular the risks and ramifications of untreated moderate to severe OSA, especially with respect to developing cardiovascular disease down the Road, including congestive heart failure, difficult to treat hypertension, cardiac arrhythmias, or stroke. Even type 2 diabetes has, in part, been linked to untreated OSA. Symptoms of untreated OSA include daytime sleepiness, memory problems, mood irritability and mood disorder such as depression and anxiety, lack of energy, as well as recurrent headaches, especially morning headaches. We talked about trying to maintain a healthy lifestyle in general, as well as the importance of weight control. I encouraged the patient to eat healthy, exercise daily and keep well hydrated, to keep a scheduled bedtime and wake time routine, to not skip any meals and eat healthy snacks in between meals. I advised the patient not to drive when feeling sleepy. I recommended the following at this time: sleep study with potential positive airway pressure titration. (We will score hypopneas at 4%).   I explained the sleep test procedure to the patient and also outlined possible surgical and non-surgical treatment options of OSA, including the use of a custom-made dental device (which would require a referral to a specialist dentist or oral surgeon), upper airway surgical options, such as pillar implants,  radiofrequency surgery, tongue base surgery, and UPPP (which would involve a referral to an ENT surgeon). Rarely, jaw surgery such as mandibular advancement may be considered.  I also explained the CPAP treatment option to the patient, who indicated that he would be willing to try CPAP if the need arises. I explained the importance of being compliant with PAP treatment, not only for insurance purposes but primarily to improve His symptoms, and for the patient's long  term health benefit, including to reduce His cardiovascular risks. I answered all his questions today and the patient was in agreement. I plan to see him back after the sleep study is completed and encouraged him to call with any interim questions, concerns, problems or updates.   Thank you very much for allowing me to participate in the care of this nice patient. If I can be of any further assistance to you please do not hesitate to call me at [phone].  Sincerely,   Star Age, MD, PhD

## 2018-11-01 NOTE — Patient Instructions (Signed)

## 2018-11-14 ENCOUNTER — Ambulatory Visit (INDEPENDENT_AMBULATORY_CARE_PROVIDER_SITE_OTHER): Payer: 59 | Admitting: Neurology

## 2018-11-14 DIAGNOSIS — R0683 Snoring: Secondary | ICD-10-CM

## 2018-11-14 DIAGNOSIS — E669 Obesity, unspecified: Secondary | ICD-10-CM

## 2018-11-14 DIAGNOSIS — G4719 Other hypersomnia: Secondary | ICD-10-CM

## 2018-11-14 DIAGNOSIS — G4733 Obstructive sleep apnea (adult) (pediatric): Secondary | ICD-10-CM

## 2018-11-14 DIAGNOSIS — R351 Nocturia: Secondary | ICD-10-CM

## 2018-11-14 DIAGNOSIS — R0681 Apnea, not elsewhere classified: Secondary | ICD-10-CM

## 2018-11-14 DIAGNOSIS — Z9189 Other specified personal risk factors, not elsewhere classified: Secondary | ICD-10-CM

## 2018-11-20 ENCOUNTER — Telehealth: Payer: Self-pay

## 2018-11-20 NOTE — Procedures (Signed)
[facility] Sleep @[facility] [place], [place] [address] NAME: Khani Paino                                                                                   DOB: Oct 25, 1955 MEDICAL RECORD no: [id number]                                                        DOS: 11/15/2018  REFERRING PHYSICIAN:  Kathlene November, MD STUDY PERFORMED: Home Sleep Test on Watch Pat HISTORY: 64 year old man with a history of diabetes, left arm tumor with status post excision, and obesity, who reports snoring and excessive daytime somnolence as well as witnessed apneas. His Epworth sleepiness score is 12 out of 24. BMI of 37.4.   STUDY RESULTS:   Total Recording Time:  7 hrs, 21 mins; Total Sleep Time: 6 hrs, 19 mins Total Apnea/Hypopnea Index (AHI): 35.5/h; RDI: 37.7/h; REM AHI: 52.5/h Average Oxygen Saturation: 94%; Lowest Oxygen Desaturation: 81%  Total Time Oxygen Saturation Below or at 88 %:  7.4 minutes  Average Heart Rate:   60 bpm (between 41 and 97 bpm) IMPRESSION: OSA RECOMMENDATION: This home sleep test demonstrates severe obstructive sleep apnea with a total AHI of 35.5/hour and O2 nadir of 81%. Given the patient's medical history and sleep related complaints, treatment with positive airway pressure (in the form of CPAP) is recommended. This will require a full night CPAP titration study for proper treatment settings, O2 monitoring and mask fitting. Based on the severity of the sleep disordered breathing an attended titration study is indicated. However, patient's insurance has denied an attended sleep study; therefore, the patient will be advised to proceed with an autoPAP titration/trial at home for now. Please note that untreated obstructive sleep apnea may carry additional perioperative morbidity. Patients with significant obstructive sleep apnea should receive perioperative PAP therapy and the surgeons and particularly the anesthesiologist should be informed of the diagnosis and the  severity of the sleep disordered breathing. The patient should be cautioned not to drive, work at heights, or operate dangerous or heavy equipment when tired or sleepy. Review and reiteration of good sleep hygiene measures should be pursued with any patient. Other causes of the patient's symptoms, including circadian rhythm disturbances, an underlying mood disorder, medication effect and/or an underlying medical problem cannot be ruled out based on this test. Clinical correlation is recommended. The patient and his referring provider will be notified of the test results. The patient will be seen in follow up in sleep clinic at [facility].  I certify that I have reviewed the raw data recording prior to the issuance of this report in accordance with the standards of the American Academy of Sleep Medicine (AASM).   Star Age, MD, PhD [facility]) Diplomat, ABPN (Neurology and Sleep)

## 2018-11-20 NOTE — Telephone Encounter (Signed)
-----   Message from Star Age, MD sent at 11/20/2018  8:41 AM EST ----- Patient referred by Dr. Larose Kells, seen by me on 11/01/18, HST on 11/15/18.    Please call and notify the patient that the recent home sleep test showed obstructive sleep apnea in the severe range. While I recommend treatment for this in the form CPAP, his insurance will not approve a sleep study for this. They will likely only approve a trial of autoPAP, which means, that we don't have to bring him in for a sleep study with CPAP, but will let him try an autoPAP machine at home, through a DME company (of his choice, or as per insurance requirement). The DME representative will educate him on how to use the machine, how to put the mask on, etc. I have placed an order in the chart. Please send referral, talk to patient, send report to referring MD. We will need a FU in sleep clinic for 10 weeks post-PAP set up, please arrange that with me or one of our NPs. Thanks,   Star Age, MD, PhD [facility])

## 2018-11-20 NOTE — Progress Notes (Signed)
Patient referred by Dr. Larose Kells, seen by me on 11/01/18, HST on 11/15/18.    Please call and notify the patient that the recent home sleep test showed obstructive sleep apnea in the severe range. While I recommend treatment for this in the form CPAP, his insurance will not approve a sleep study for this. They will likely only approve a trial of autoPAP, which means, that we don't have to bring him in for a sleep study with CPAP, but will let him try an autoPAP machine at home, through a DME company (of his choice, or as per insurance requirement). The DME representative will educate him on how to use the machine, how to put the mask on, etc. I have placed an order in the chart. Please send referral, talk to patient, send report to referring MD. We will need a FU in sleep clinic for 10 weeks post-PAP set up, please arrange that with me or one of our NPs. Thanks,   Star Age, MD, PhD [facility])

## 2018-11-20 NOTE — Progress Notes (Signed)
cpap

## 2018-11-20 NOTE — Addendum Note (Signed)
Addended by: Star Age on: 11/20/2018 08:41 AM   Modules accepted: Orders

## 2018-11-20 NOTE — Telephone Encounter (Signed)
I called pt. I advised pt that Dr. Rexene Alberts reviewed their sleep study results and found that pt has severe osa. Dr. Rexene Alberts recommends that pt start an auto pap at home since pt's insurance will likely deny an in lab cpap titration study. I reviewed PAP compliance expectations with the pt. Pt is agreeable to starting an auto-PAP. I advised pt that an order will be sent to a DME, [facility], and [facility] will call the pt within about one week after they file with the pt's insurance. [facility] will show the pt how to use the machine, fit for masks, and troubleshoot the auto-PAP if needed. A follow up appt was made for insurance purposes with Dr. Rexene Alberts on 01/29/2019 at 10:30am. Pt verbalized understanding to arrive 15 minutes early and bring their auto-PAP. A letter with all of this information in it will be mailed to the pt as a reminder. I verified with the pt that the address we have on file is correct. Pt verbalized understanding of results. Pt had no questions at this time but was encouraged to call back if questions arise. I have sent the order to [facility] and have received confirmation that they have received the order.

## 2018-11-27 DIAGNOSIS — G4733 Obstructive sleep apnea (adult) (pediatric): Secondary | ICD-10-CM | POA: Diagnosis not present

## 2018-12-13 ENCOUNTER — Encounter (HOSPITAL_COMMUNITY): Payer: Self-pay | Admitting: Emergency Medicine

## 2018-12-13 ENCOUNTER — Observation Stay (HOSPITAL_COMMUNITY)
Admission: EM | Admit: 2018-12-13 | Discharge: 2018-12-14 | Disposition: A | Discharge status: Home / Self Care | Payer: 59 | Attending: Internal Medicine | Admitting: Internal Medicine

## 2018-12-13 ENCOUNTER — Emergency Department (HOSPITAL_COMMUNITY): Payer: 59

## 2018-12-13 ENCOUNTER — Other Ambulatory Visit: Payer: Self-pay

## 2018-12-13 DIAGNOSIS — Z79899 Other long term (current) drug therapy: Secondary | ICD-10-CM | POA: Diagnosis not present

## 2018-12-13 DIAGNOSIS — E669 Obesity, unspecified: Secondary | ICD-10-CM | POA: Diagnosis not present

## 2018-12-13 DIAGNOSIS — G4733 Obstructive sleep apnea (adult) (pediatric): Secondary | ICD-10-CM | POA: Insufficient documentation

## 2018-12-13 DIAGNOSIS — I10 Essential (primary) hypertension: Secondary | ICD-10-CM | POA: Diagnosis not present

## 2018-12-13 DIAGNOSIS — R001 Bradycardia, unspecified: Secondary | ICD-10-CM | POA: Diagnosis not present

## 2018-12-13 DIAGNOSIS — Z9989 Dependence on other enabling machines and devices: Secondary | ICD-10-CM

## 2018-12-13 DIAGNOSIS — E119 Type 2 diabetes mellitus without complications: Secondary | ICD-10-CM | POA: Diagnosis not present

## 2018-12-13 DIAGNOSIS — R42 Dizziness and giddiness: Secondary | ICD-10-CM

## 2018-12-13 DIAGNOSIS — Z7982 Long term (current) use of aspirin: Secondary | ICD-10-CM | POA: Insufficient documentation

## 2018-12-13 DIAGNOSIS — Z6837 Body mass index (BMI) 37.0-37.9, adult: Secondary | ICD-10-CM | POA: Diagnosis not present

## 2018-12-13 DIAGNOSIS — Z8249 Family history of ischemic heart disease and other diseases of the circulatory system: Secondary | ICD-10-CM | POA: Diagnosis not present

## 2018-12-13 DIAGNOSIS — H81399 Other peripheral vertigo, unspecified ear: Principal | ICD-10-CM | POA: Insufficient documentation

## 2018-12-13 DIAGNOSIS — I672 Cerebral atherosclerosis: Secondary | ICD-10-CM | POA: Diagnosis not present

## 2018-12-13 DIAGNOSIS — I6602 Occlusion and stenosis of left middle cerebral artery: Secondary | ICD-10-CM | POA: Diagnosis not present

## 2018-12-13 DIAGNOSIS — R270 Ataxia, unspecified: Secondary | ICD-10-CM | POA: Diagnosis not present

## 2018-12-13 DIAGNOSIS — R0902 Hypoxemia: Secondary | ICD-10-CM | POA: Diagnosis not present

## 2018-12-13 LAB — CBC WITH DIFFERENTIAL/PLATELET
Abs Immature Granulocytes: 0.01 10*3/uL (ref 0.00–0.07)
BASOS ABS: 0 10*3/uL (ref 0.0–0.1)
BASOS PCT: 0 %
EOS ABS: 0.1 10*3/uL (ref 0.0–0.5)
EOS PCT: 2 %
HEMATOCRIT: 45.3 % (ref 39.0–52.0)
Hemoglobin: 13.9 g/dL (ref 13.0–17.0)
Immature Granulocytes: 0 %
LYMPHS ABS: 1.9 10*3/uL (ref 0.7–4.0)
Lymphocytes Relative: 33 %
MCH: 25.9 pg — ABNORMAL LOW (ref 26.0–34.0)
MCHC: 30.7 g/dL (ref 30.0–36.0)
MCV: 84.4 fL (ref 80.0–100.0)
Monocytes Absolute: 0.4 10*3/uL (ref 0.1–1.0)
Monocytes Relative: 8 %
NRBC: 0 % (ref 0.0–0.2)
Neutro Abs: 3.2 10*3/uL (ref 1.7–7.7)
Neutrophils Relative %: 57 %
PLATELETS: 249 10*3/uL (ref 150–400)
RBC: 5.37 MIL/uL (ref 4.22–5.81)
RDW: 14.7 % (ref 11.5–15.5)
WBC: 5.7 10*3/uL (ref 4.0–10.5)

## 2018-12-13 LAB — BASIC METABOLIC PANEL
ANION GAP: 13 (ref 5–15)
BUN: 13 mg/dL (ref 6–20)
CALCIUM: 8.5 mg/dL — AB (ref 8.9–10.3)
CO2: 20 mmol/L — AB (ref 22–32)
Chloride: 106 mmol/L (ref 98–111)
Creatinine, Ser: 1.22 mg/dL (ref 0.61–1.24)
GFR calc non Af Amer: >60 mL/min (ref >60)
Glucose, Bld: 129 mg/dL — ABNORMAL HIGH (ref 70–99)
Potassium: 3.6 mmol/L (ref 3.5–5.1)
Sodium: 139 mmol/L (ref 135–145)

## 2018-12-13 LAB — TROPONIN I

## 2018-12-13 MED ORDER — SODIUM CHLORIDE 0.9 % IV SOLN
INTRAVENOUS | Status: DC
  Administered 2018-12-13 – 2018-12-14 (×3): via INTRAVENOUS

## 2018-12-13 MED ORDER — PROMETHAZINE HCL 25 MG/ML IJ SOLN
25.0000 mg | Freq: Once | INTRAMUSCULAR | Status: AC
  Administered 2018-12-13: 25 mg via INTRAVENOUS
  Filled 2018-12-13: qty 1

## 2018-12-13 MED ORDER — MECLIZINE HCL 25 MG PO TABS
25.0000 mg | ORAL_TABLET | Freq: Once | ORAL | Status: AC
  Administered 2018-12-13: 25 mg via ORAL
  Filled 2018-12-13: qty 1

## 2018-12-13 MED ORDER — MECLIZINE HCL 25 MG PO TABS: 25.0000 mg | ORAL_TABLET | Freq: Three times a day (TID) | ORAL | 0 refills | Status: DC | PRN

## 2018-12-13 MED ORDER — ENOXAPARIN SODIUM 40 MG/0.4ML ~~LOC~~ SOLN
40.0000 mg | SUBCUTANEOUS | Status: DC
  Administered 2018-12-13: 40 mg via SUBCUTANEOUS
  Filled 2018-12-13: qty 0.4

## 2018-12-13 MED ORDER — ONDANSETRON HCL 4 MG/2ML IJ SOLN: 4.0000 mg | Freq: Four times a day (QID) | INTRAMUSCULAR | Status: DC | PRN

## 2018-12-13 MED ORDER — GADOBUTROL 1 MMOL/ML IV SOLN
10.0000 mL | Freq: Once | INTRAVENOUS | Status: AC | PRN
  Administered 2018-12-13: 10 mL via INTRAVENOUS

## 2018-12-13 MED ORDER — MECLIZINE HCL 12.5 MG PO TABS
12.5000 mg | ORAL_TABLET | Freq: Three times a day (TID) | ORAL | Status: DC | PRN
  Filled 2018-12-13: qty 1

## 2018-12-13 MED ORDER — LORAZEPAM 2 MG/ML IJ SOLN
1.0000 mg | Freq: Once | INTRAMUSCULAR | Status: AC
  Administered 2018-12-13: 1 mg via INTRAVENOUS
  Filled 2018-12-13: qty 1

## 2018-12-13 MED ORDER — POTASSIUM CHLORIDE CRYS ER 20 MEQ PO TBCR
40.0000 meq | EXTENDED_RELEASE_TABLET | Freq: Once | ORAL | Status: AC
  Administered 2018-12-13: 40 meq via ORAL
  Filled 2018-12-13: qty 2

## 2018-12-13 MED ORDER — SODIUM CHLORIDE 0.9 % IV BOLUS
1000.0000 mL | Freq: Once | INTRAVENOUS | Status: AC
  Administered 2018-12-13: 1000 mL via INTRAVENOUS

## 2018-12-13 NOTE — ED Notes (Signed)
ED TO INPATIENT HANDOFF REPORT  ED Nurse Name and Phone #: Leodis Rains  S Name/Age/Gender Gary [name] 63 y.o. male Room/Bed: 039C/039C  Code Status   Code Status: Not on file  Home/SNF/Other Home Patient oriented to: self, place, time and situation Is this baseline? Yes   Triage Complete: Triage complete  Chief Complaint dizziness/n/v  Triage Note Pt was at work, had a sudden onset of dizziness and nausea. Pt has been fasting for 18 hours; only drinking water.. CBG 96. BP 156/85, HR 54. No blurred vision, no weakness, no HA, denies CP/SOB. Pt continues to be dizzy. Pt has vomited twice. EMS gave 4mg  zofran.    Allergies No Known Allergies  Level of Care/Admitting Diagnosis ED Disposition    ED Disposition Condition [facility] Area: [place] [[id number]]  Level of Care: Medical Telemetry [104]  Diagnosis: Vertigo [833825]  Admitting Physician: Florencia Reasons [[id number]]  Attending Physician: Florencia Reasons [[id number]]  PT Class (Do Not Modify): Observation [104]  PT Acc Code (Do Not Modify): Observation [10022]       B Medical/Surgery History Past Medical History:  Diagnosis Date  . Diabetes-- A1c 6.7 (2016) 05/15/2015  . Leiomyosarcoma ([place]) 05/20/2015  . Lipoma of arm 2016   Past Surgical History:  Procedure Laterality Date  . INGUINAL HERNIA REPAIR  2000   unilateral  . MASS EXCISION Left 08/04/2015   Procedure: EXCISION OF MASS ON LEFT ARM;  Surgeon: [name], MD;  Location: [facility];  Service: General;  Laterality: Left;     A IV Location/Drains/Wounds Patient Lines/Drains/Airways Status   Active Line/Drains/Airways    Name:   Placement date:   Placement time:   Site:   Days:   Peripheral IV 12/13/18 Left Hand   12/13/18    0741    Hand   less than 1   Incision (Closed) 08/04/15 Arm Left   08/04/15    0740     1227          Intake/Output Last 24 hours  Intake/Output Summary (Last 24 hours) at 12/13/2018  1829 Last data filed at 12/13/2018 1042 Gross per 24 hour  Intake 1000 ml  Output -  Net 1000 ml    Labs/Imaging Results for orders placed or performed during the hospital encounter of 12/13/18 (from the past 48 hour(s))  Basic metabolic panel     Status: Abnormal   Collection Time: 12/13/18  8:04 AM  Result Value Ref Range   Sodium 139 135 - 145 mmol/L   Potassium 3.6 3.5 - 5.1 mmol/L   Chloride 106 98 - 111 mmol/L   CO2 20 (L) 22 - 32 mmol/L   Glucose, Bld 129 (H) 70 - 99 mg/dL   BUN 13 6 - 20 mg/dL   Creatinine, Ser 1.22 0.61 - 1.24 mg/dL   Calcium 8.5 (L) 8.9 - 10.3 mg/dL   GFR calc non Af Amer >60 >60 mL/min   GFR calc Af Amer >60 >60 mL/min   Anion gap 13 5 - 15    Comment: Performed at [facility], [address], [place], [place] [address]  CBC with Differential     Status: Abnormal   Collection Time: 12/13/18  8:04 AM  Result Value Ref Range   WBC 5.7 4.0 - 10.5 K/uL   RBC 5.37 4.22 - 5.81 MIL/uL   Hemoglobin 13.9 13.0 - 17.0 g/dL   HCT 45.3 39.0 - 52.0 %  MCV 84.4 80.0 - 100.0 fL   MCH 25.9 (L) 26.0 - 34.0 pg   MCHC 30.7 30.0 - 36.0 g/dL   RDW 14.7 11.5 - 15.5 %   Platelets 249 150 - 400 K/uL   nRBC 0.0 0.0 - 0.2 %   Neutrophils Relative % 57 %   Neutro Abs 3.2 1.7 - 7.7 K/uL   Lymphocytes Relative 33 %   Lymphs Abs 1.9 0.7 - 4.0 K/uL   Monocytes Relative 8 %   Monocytes Absolute 0.4 0.1 - 1.0 K/uL   Eosinophils Relative 2 %   Eosinophils Absolute 0.1 0.0 - 0.5 K/uL   Basophils Relative 0 %   Basophils Absolute 0.0 0.0 - 0.1 K/uL   Immature Granulocytes 0 %   Abs Immature Granulocytes 0.01 0.00 - 0.07 K/uL    Comment: Performed at [facility] [address], [place], [place] [address]  Troponin I - Once     Status: None   Collection Time: 12/13/18  8:04 AM  Result Value Ref Range   Troponin I <0.03 <0.03 ng/mL    Comment: Performed at [facility] [address], [place], [place] [address]   Mr Jodene Nam Head Wo Contrast  Result  Date: 12/13/2018 CLINICAL DATA:  Ataxia.  Dizziness and vomiting. EXAM: MR HEAD WITHOUT CONTRAST MR CIRCLE OF WILLIS WITHOUT CONTRAST MRA OF THE NECK WITHOUT AND WITH CONTRAST TECHNIQUE: Multiplanar, multiecho pulse sequences of the brain, circle of willis and surrounding structures were obtained without intravenous contrast. Angiographic images of the neck were obtained using MRA technique without and with intravenous contrast. CONTRAST:  10 mL Gadavist COMPARISON:  Head CT 02/10/2016 FINDINGS: MR HEAD FINDINGS Brain: There is no evidence of acute infarct, intracranial hemorrhage, mass, midline shift, or extra-axial fluid collection. The ventricles and sulci are normal. A few punctate foci of T2 hyperintensity in the cerebral white matter are nonspecific and not greater than expected for age. The cerebellum is unremarkable. Vascular: Major intracranial vascular flow voids are preserved. Skull and upper cervical spine: No suspicious marrow lesion. Sinuses/Orbits: Unremarkable orbits. Mild mucosal thickening in the paranasal sinuses. No significant mastoid fluid. Other: None. MR CIRCLE OF WILLIS FINDINGS The study is mildly motion degraded. The visualized distal vertebral arteries are patent with the right being dominant. The left vertebral artery is diminutive distal to the PICA origin. The basilar artery is widely patent. There is a small left posterior communicating artery with an infundibulum at its origin. The right PCA is patent without evidence of significant proximal stenosis. There is a moderate left P1 stenosis. There is diminished signal throughout the left P2 segment which is felt to at least be partly artifactual given patency on the separate contrast-enhanced neck MRA, however assessment for an underlying stenosis is limited. The internal carotid arteries are patent from skull base to carotid termini without evidence of significant stenosis. Focal signal loss in the right ICA near the petrous-cavernous  junction is artifactual given normal appearance on the neck MRA. ACAs and MCAs are patent with branch vessel irregularity and mild stenosis at the left MCA bifurcation. No aneurysm is identified. MRA NECK FINDINGS The study is mildly to moderately motion degraded. There is a standard 3 vessel aortic arch configuration. The brachiocephalic and subclavian arteries are widely patent. Apparent moderate long segment narrowing of the proximal left common carotid artery is favored to be secondary to motion artifact although an underlying stenosis is also possible. The right common carotid artery is widely patent,  as are both internal carotid arteries. Antegrade flow is seen in both vertebral arteries. The right vertebral artery is moderately to strongly dominant without evidence of significant stenosis. The left V1 segment was incompletely imaged on the postcontrast MRA and is not visualized on the noncontrast time-of-flight MRA due to motion artifact and poor signal in this region. The left V2 and V3 segments are patent without evidence of flow limiting stenosis. IMPRESSION: 1. Unremarkable appearance of the brain for age. No acute intracranial abnormality. 2. Intracranial atherosclerosis including a moderate left P1 stenosis. 3. Motion degraded neck MRA with limited assessment of the proximal left common carotid artery and proximal left vertebral artery. No significant carotid or vertebral artery stenosis elsewhere in the neck. Electronically Signed   By: Logan Bores M.D.   On: 12/13/2018 15:54   Mr Angiogram Neck W Or Wo Contrast  Result Date: 12/13/2018 CLINICAL DATA:  Ataxia.  Dizziness and vomiting. EXAM: MR HEAD WITHOUT CONTRAST MR CIRCLE OF WILLIS WITHOUT CONTRAST MRA OF THE NECK WITHOUT AND WITH CONTRAST TECHNIQUE: Multiplanar, multiecho pulse sequences of the brain, circle of willis and surrounding structures were obtained without intravenous contrast. Angiographic images of the neck were obtained using MRA  technique without and with intravenous contrast. CONTRAST:  10 mL Gadavist COMPARISON:  Head CT 02/10/2016 FINDINGS: MR HEAD FINDINGS Brain: There is no evidence of acute infarct, intracranial hemorrhage, mass, midline shift, or extra-axial fluid collection. The ventricles and sulci are normal. A few punctate foci of T2 hyperintensity in the cerebral white matter are nonspecific and not greater than expected for age. The cerebellum is unremarkable. Vascular: Major intracranial vascular flow voids are preserved. Skull and upper cervical spine: No suspicious marrow lesion. Sinuses/Orbits: Unremarkable orbits. Mild mucosal thickening in the paranasal sinuses. No significant mastoid fluid. Other: None. MR CIRCLE OF WILLIS FINDINGS The study is mildly motion degraded. The visualized distal vertebral arteries are patent with the right being dominant. The left vertebral artery is diminutive distal to the PICA origin. The basilar artery is widely patent. There is a small left posterior communicating artery with an infundibulum at its origin. The right PCA is patent without evidence of significant proximal stenosis. There is a moderate left P1 stenosis. There is diminished signal throughout the left P2 segment which is felt to at least be partly artifactual given patency on the separate contrast-enhanced neck MRA, however assessment for an underlying stenosis is limited. The internal carotid arteries are patent from skull base to carotid termini without evidence of significant stenosis. Focal signal loss in the right ICA near the petrous-cavernous junction is artifactual given normal appearance on the neck MRA. ACAs and MCAs are patent with branch vessel irregularity and mild stenosis at the left MCA bifurcation. No aneurysm is identified. MRA NECK FINDINGS The study is mildly to moderately motion degraded. There is a standard 3 vessel aortic arch configuration. The brachiocephalic and subclavian arteries are widely patent.  Apparent moderate long segment narrowing of the proximal left common carotid artery is favored to be secondary to motion artifact although an underlying stenosis is also possible. The right common carotid artery is widely patent, as are both internal carotid arteries. Antegrade flow is seen in both vertebral arteries. The right vertebral artery is moderately to strongly dominant without evidence of significant stenosis. The left V1 segment was incompletely imaged on the postcontrast MRA and is not visualized on the noncontrast time-of-flight MRA due to motion artifact and poor signal in this region. The left V2 and V3  segments are patent without evidence of flow limiting stenosis. IMPRESSION: 1. Unremarkable appearance of the brain for age. No acute intracranial abnormality. 2. Intracranial atherosclerosis including a moderate left P1 stenosis. 3. Motion degraded neck MRA with limited assessment of the proximal left common carotid artery and proximal left vertebral artery. No significant carotid or vertebral artery stenosis elsewhere in the neck. Electronically Signed   By: Logan Bores M.D.   On: 12/13/2018 15:54   Mr Brain Wo Contrast  Result Date: 12/13/2018 CLINICAL DATA:  Ataxia.  Dizziness and vomiting. EXAM: MR HEAD WITHOUT CONTRAST MR CIRCLE OF WILLIS WITHOUT CONTRAST MRA OF THE NECK WITHOUT AND WITH CONTRAST TECHNIQUE: Multiplanar, multiecho pulse sequences of the brain, circle of willis and surrounding structures were obtained without intravenous contrast. Angiographic images of the neck were obtained using MRA technique without and with intravenous contrast. CONTRAST:  10 mL Gadavist COMPARISON:  Head CT 02/10/2016 FINDINGS: MR HEAD FINDINGS Brain: There is no evidence of acute infarct, intracranial hemorrhage, mass, midline shift, or extra-axial fluid collection. The ventricles and sulci are normal. A few punctate foci of T2 hyperintensity in the cerebral white matter are nonspecific and not greater  than expected for age. The cerebellum is unremarkable. Vascular: Major intracranial vascular flow voids are preserved. Skull and upper cervical spine: No suspicious marrow lesion. Sinuses/Orbits: Unremarkable orbits. Mild mucosal thickening in the paranasal sinuses. No significant mastoid fluid. Other: None. MR CIRCLE OF WILLIS FINDINGS The study is mildly motion degraded. The visualized distal vertebral arteries are patent with the right being dominant. The left vertebral artery is diminutive distal to the PICA origin. The basilar artery is widely patent. There is a small left posterior communicating artery with an infundibulum at its origin. The right PCA is patent without evidence of significant proximal stenosis. There is a moderate left P1 stenosis. There is diminished signal throughout the left P2 segment which is felt to at least be partly artifactual given patency on the separate contrast-enhanced neck MRA, however assessment for an underlying stenosis is limited. The internal carotid arteries are patent from skull base to carotid termini without evidence of significant stenosis. Focal signal loss in the right ICA near the petrous-cavernous junction is artifactual given normal appearance on the neck MRA. ACAs and MCAs are patent with branch vessel irregularity and mild stenosis at the left MCA bifurcation. No aneurysm is identified. MRA NECK FINDINGS The study is mildly to moderately motion degraded. There is a standard 3 vessel aortic arch configuration. The brachiocephalic and subclavian arteries are widely patent. Apparent moderate long segment narrowing of the proximal left common carotid artery is favored to be secondary to motion artifact although an underlying stenosis is also possible. The right common carotid artery is widely patent, as are both internal carotid arteries. Antegrade flow is seen in both vertebral arteries. The right vertebral artery is moderately to strongly dominant without evidence  of significant stenosis. The left V1 segment was incompletely imaged on the postcontrast MRA and is not visualized on the noncontrast time-of-flight MRA due to motion artifact and poor signal in this region. The left V2 and V3 segments are patent without evidence of flow limiting stenosis. IMPRESSION: 1. Unremarkable appearance of the brain for age. No acute intracranial abnormality. 2. Intracranial atherosclerosis including a moderate left P1 stenosis. 3. Motion degraded neck MRA with limited assessment of the proximal left common carotid artery and proximal left vertebral artery. No significant carotid or vertebral artery stenosis elsewhere in the neck. Electronically Signed  By: Logan Bores M.D.   On: 12/13/2018 15:54    Pending Labs Unresulted Labs (From admission, onward)    Start     Ordered   12/14/18 4403  Basic metabolic panel  Tomorrow morning,   R     12/13/18 1704   12/14/18 0500  Magnesium  Tomorrow morning,   STAT     12/13/18 1704   12/14/18 0500  TSH  Tomorrow morning,   R     12/13/18 1759   Signed and Held  HIV antibody (Routine Testing)  Tomorrow morning,   R     Signed and Held   Signed and Held  CBC  (enoxaparin (LOVENOX)    CrCl >/= 30 ml/min)  Once,   R    Comments:  Baseline for enoxaparin therapy IF NOT ALREADY DRAWN.  Notify MD if PLT < 100 K.    Signed and Held   Signed and Held  Creatinine, serum  (enoxaparin (LOVENOX)    CrCl >/= 30 ml/min)  Once,   R    Comments:  Baseline for enoxaparin therapy IF NOT ALREADY DRAWN.    Signed and Held   Signed and Held  Creatinine, serum  (enoxaparin (LOVENOX)    CrCl >/= 30 ml/min)  Weekly,   R    Comments:  while on enoxaparin therapy    Signed and Held          Vitals/Pain Today's Vitals   12/13/18 1615 12/13/18 1730 12/13/18 1745 12/13/18 1800  BP: (!) 143/94 128/79 128/77 119/78  Pulse: (!) 58 (!) 55 (!) 54 (!) 56  Resp:      Temp:      TempSrc:      SpO2: 99% 98% 100% 100%  Weight:      Height:       PainSc:        Isolation Precautions No active isolations  Medications Medications  0.9 %  sodium chloride infusion ( Intravenous New Bag/Given 12/13/18 1707)  ondansetron (ZOFRAN) injection 4 mg (has no administration in time range)  meclizine (ANTIVERT) tablet 12.5 mg (has no administration in time range)  potassium chloride SA (K-DUR,KLOR-CON) CR tablet 40 mEq (has no administration in time range)  LORazepam (ATIVAN) injection 1 mg (1 mg Intravenous Given 12/13/18 0802)  sodium chloride 0.9 % bolus 1,000 mL (0 mLs Intravenous Stopped 12/13/18 1042)  meclizine (ANTIVERT) tablet 25 mg (25 mg Oral Given 12/13/18 0918)  promethazine (PHENERGAN) injection 25 mg (25 mg Intravenous Given 12/13/18 1214)  gadobutrol (GADAVIST) 1 MMOL/ML injection 10 mL (10 mLs Intravenous Contrast Given 12/13/18 1315)  meclizine (ANTIVERT) tablet 25 mg (25 mg Oral Given 12/13/18 1657)    Mobility Walks at home- but is too dizzy to walk here  Low fall risk   Focused Assessments Neuro Assessment Handoff:  Swallow screen pass? Yes    NIH Stroke Scale ( + Modified Stroke Scale Criteria)  Level of Consciousness (1a.)   : Alert, keenly responsive LOC Questions (1b. )   +: Answers both questions correctly LOC Commands (1c. )   + : Performs both tasks correctly Best Gaze (2. )  +: Normal Visual (3. )  +: No visual loss Facial Palsy (4. )    : Normal symmetrical movements Motor Arm, Left (5a. )   +: No drift Motor Arm, Right (5b. )   +: No drift Motor Leg, Left (6a. )   +: No drift Motor Leg, Right (6b. )   +: No  drift Limb Ataxia (7. ): Absent Sensory (8. )   +: Normal, no sensory loss Best Language (9. )   +: No aphasia Dysarthria (10. ): Normal Extinction/Inattention (11.)   +: No Abnormality Modified SS Total  +: 0 Complete NIHSS TOTAL: 0     Neuro Assessment: Within Defined Limits Neuro Checks:      Last Documented NIHSS Modified Score: 0 (12/13/18 1745) Has TPA been given? No If patient is a Neuro  Trauma and patient is going to OR before floor call report to [facility] nurse: [phone] or [phone]     R Recommendations: See Admitting Provider Note  Report given to:   Additional Notes:  No neuro deficits. Vertigo

## 2018-12-13 NOTE — ED Notes (Signed)
Pt vomiting in MRI. Notified MD and gave pt phenergan in MRI

## 2018-12-13 NOTE — ED Notes (Signed)
Attempted to ambulate pt, pt became dizzy standing up. This NT placed pt back in stretcher, notified [facility](RN)

## 2018-12-13 NOTE — H&P (Signed)
History and Physical  CARL BUTNER ONG:[id number] DOB: [birth year] DOA: 12/13/2018  Referring physician: EDP PCP: Colon Branch, MD   Chief Complaint: dizziness  HPI: Gary [name] is a 63 y.o. male   Pt has no prior medical history other than OSA on cpap, had a sudden onset of dizziness/room spinning and nausea while at work this am. Pt has been fasting for 18 hours; only drinking water. CBG 96. BP 156/85, HR 54. No blurred vision, no weakness, no HA, denies CP/SOB. Pt continues to be dizzy. Pt has vomited twice. EMS gave 4mg  zofran. he is brought to the ED.  ED course: has sinus bradycardia, heart rate in the 50's, bp stable, no fever, no hypoxia, has significant nystagmus on exam,  mri brain no acute findings, basic labs unremarkable. He received antiemetics and meclizine, However, he remains very symptomatic,  patient nearly fell after attempting first step, hospitalist called to further manage the patient.  He does reports chronic tinnitus,  no recent illness, he does not take any meds regularly.  Review of Systems:  Detail per HPI, Review of systems are otherwise negative  Past Medical History:  Diagnosis Date  . Diabetes-- A1c 6.7 (2016) 05/15/2015  . Leiomyosarcoma ([place]) 05/20/2015  . Lipoma of arm 2016   Past Surgical History:  Procedure Laterality Date  . INGUINAL HERNIA REPAIR  2000   unilateral  . MASS EXCISION Left 08/04/2015   Procedure: EXCISION OF MASS ON LEFT ARM;  Surgeon: [name], MD;  Location: [facility];  Service: General;  Laterality: Left;   Social History:  reports that he has never smoked. He has never used smokeless tobacco. He reports previous alcohol use. He reports that he does not use drugs. Patient lives at home & is able to participate in activities of daily living independently   No Known Allergies  Family History  Problem Relation Age of Onset  . Hypertension Mother   . Diabetes Mother   . Colon cancer Father        <60  .  Heart attack Neg Hx   . Prostate cancer Neg Hx       Prior to Admission medications   Medication Sig Start Date End Date Taking? Authorizing Provider  aspirin 81 MG tablet Take 81 mg by mouth daily.    [provider]  meclizine (ANTIVERT) 25 MG tablet Take 1 tablet (25 mg total) by mouth 3 (three) times daily as needed for dizziness. 12/13/18   Hayden Rasmussen, MD  Naproxen Sodium (ALEVE) 220 MG CAPS Aleve    [provider]  sildenafil (REVATIO) 20 MG tablet Take 3-4 tablets (60-80 mg total) by mouth at bedtime as needed. 08/29/18   Colon Branch, MD    Physical Exam: BP 128/79   Pulse (!) 55   Temp 97.9 F (36.6 C) (Oral)   Resp 15   Ht 6\' 1"  (1.854 m)   Wt 129.3 kg   SpO2 98%   BMI 37.60 kg/m   General:  NAD, + nystagmus  Eyes: PERRL ENT: unremarkable Neck: supple, no JVD Cardiovascular: RRR Respiratory: CTABL Abdomen: soft/ND/ND, positive bowel sounds Skin: no rash Musculoskeletal:  No edema Psychiatric: calm/cooperative Neurologic: no focal findings            Labs on Admission:  Basic Metabolic Panel: Recent Labs  Lab 12/13/18 0804  NA 139  K 3.6  CL 106  CO2 20*  GLUCOSE 129*  BUN 13  CREATININE 1.22  CALCIUM 8.5*   Liver Function Tests: No results for input(s): AST, ALT, ALKPHOS, BILITOT, PROT, ALBUMIN in the last 168 hours. No results for input(s): LIPASE, AMYLASE in the last 168 hours. No results for input(s): AMMONIA in the last 168 hours. CBC: Recent Labs  Lab 12/13/18 0804  WBC 5.7  NEUTROABS 3.2  HGB 13.9  HCT 45.3  MCV 84.4  PLT 249   Cardiac Enzymes: Recent Labs  Lab 12/13/18 0804  TROPONINI <0.03    BNP (last 3 results) No results for input(s): BNP in the last 8760 hours.  ProBNP (last 3 results) No results for input(s): PROBNP in the last 8760 hours.  CBG: No results for input(s): GLUCAP in the last 168 hours.  Radiological Exams on Admission: Mr Virgel Paling WG Contrast  Result Date:  12/13/2018 CLINICAL DATA:  Ataxia.  Dizziness and vomiting. EXAM: MR HEAD WITHOUT CONTRAST MR CIRCLE OF WILLIS WITHOUT CONTRAST MRA OF THE NECK WITHOUT AND WITH CONTRAST TECHNIQUE: Multiplanar, multiecho pulse sequences of the brain, circle of willis and surrounding structures were obtained without intravenous contrast. Angiographic images of the neck were obtained using MRA technique without and with intravenous contrast. CONTRAST:  10 mL Gadavist COMPARISON:  Head CT 02/10/2016 FINDINGS: MR HEAD FINDINGS Brain: There is no evidence of acute infarct, intracranial hemorrhage, mass, midline shift, or extra-axial fluid collection. The ventricles and sulci are normal. A few punctate foci of T2 hyperintensity in the cerebral white matter are nonspecific and not greater than expected for age. The cerebellum is unremarkable. Vascular: Major intracranial vascular flow voids are preserved. Skull and upper cervical spine: No suspicious marrow lesion. Sinuses/Orbits: Unremarkable orbits. Mild mucosal thickening in the paranasal sinuses. No significant mastoid fluid. Other: None. MR CIRCLE OF WILLIS FINDINGS The study is mildly motion degraded. The visualized distal vertebral arteries are patent with the right being dominant. The left vertebral artery is diminutive distal to the PICA origin. The basilar artery is widely patent. There is a small left posterior communicating artery with an infundibulum at its origin. The right PCA is patent without evidence of significant proximal stenosis. There is a moderate left P1 stenosis. There is diminished signal throughout the left P2 segment which is felt to at least be partly artifactual given patency on the separate contrast-enhanced neck MRA, however assessment for an underlying stenosis is limited. The internal carotid arteries are patent from skull base to carotid termini without evidence of significant stenosis. Focal signal loss in the right ICA near the petrous-cavernous  junction is artifactual given normal appearance on the neck MRA. ACAs and MCAs are patent with branch vessel irregularity and mild stenosis at the left MCA bifurcation. No aneurysm is identified. MRA NECK FINDINGS The study is mildly to moderately motion degraded. There is a standard 3 vessel aortic arch configuration. The brachiocephalic and subclavian arteries are widely patent. Apparent moderate long segment narrowing of the proximal left common carotid artery is favored to be secondary to motion artifact although an underlying stenosis is also possible. The right common carotid artery is widely patent, as are both internal carotid arteries. Antegrade flow is seen in both vertebral arteries. The right vertebral artery is moderately to strongly dominant without evidence of significant stenosis. The left V1 segment was incompletely imaged on the postcontrast MRA and is not visualized on the noncontrast time-of-flight MRA due to motion artifact and poor signal in this region. The left V2 and V3 segments are patent without evidence of flow limiting stenosis. IMPRESSION: 1. Unremarkable appearance  of the brain for age. No acute intracranial abnormality. 2. Intracranial atherosclerosis including a moderate left P1 stenosis. 3. Motion degraded neck MRA with limited assessment of the proximal left common carotid artery and proximal left vertebral artery. No significant carotid or vertebral artery stenosis elsewhere in the neck. Electronically Signed   By: Logan Bores M.D.   On: 12/13/2018 15:54   Mr Angiogram Neck W Or Wo Contrast  Result Date: 12/13/2018 CLINICAL DATA:  Ataxia.  Dizziness and vomiting. EXAM: MR HEAD WITHOUT CONTRAST MR CIRCLE OF WILLIS WITHOUT CONTRAST MRA OF THE NECK WITHOUT AND WITH CONTRAST TECHNIQUE: Multiplanar, multiecho pulse sequences of the brain, circle of willis and surrounding structures were obtained without intravenous contrast. Angiographic images of the neck were obtained using MRA  technique without and with intravenous contrast. CONTRAST:  10 mL Gadavist COMPARISON:  Head CT 02/10/2016 FINDINGS: MR HEAD FINDINGS Brain: There is no evidence of acute infarct, intracranial hemorrhage, mass, midline shift, or extra-axial fluid collection. The ventricles and sulci are normal. A few punctate foci of T2 hyperintensity in the cerebral white matter are nonspecific and not greater than expected for age. The cerebellum is unremarkable. Vascular: Major intracranial vascular flow voids are preserved. Skull and upper cervical spine: No suspicious marrow lesion. Sinuses/Orbits: Unremarkable orbits. Mild mucosal thickening in the paranasal sinuses. No significant mastoid fluid. Other: None. MR CIRCLE OF WILLIS FINDINGS The study is mildly motion degraded. The visualized distal vertebral arteries are patent with the right being dominant. The left vertebral artery is diminutive distal to the PICA origin. The basilar artery is widely patent. There is a small left posterior communicating artery with an infundibulum at its origin. The right PCA is patent without evidence of significant proximal stenosis. There is a moderate left P1 stenosis. There is diminished signal throughout the left P2 segment which is felt to at least be partly artifactual given patency on the separate contrast-enhanced neck MRA, however assessment for an underlying stenosis is limited. The internal carotid arteries are patent from skull base to carotid termini without evidence of significant stenosis. Focal signal loss in the right ICA near the petrous-cavernous junction is artifactual given normal appearance on the neck MRA. ACAs and MCAs are patent with branch vessel irregularity and mild stenosis at the left MCA bifurcation. No aneurysm is identified. MRA NECK FINDINGS The study is mildly to moderately motion degraded. There is a standard 3 vessel aortic arch configuration. The brachiocephalic and subclavian arteries are widely patent.  Apparent moderate long segment narrowing of the proximal left common carotid artery is favored to be secondary to motion artifact although an underlying stenosis is also possible. The right common carotid artery is widely patent, as are both internal carotid arteries. Antegrade flow is seen in both vertebral arteries. The right vertebral artery is moderately to strongly dominant without evidence of significant stenosis. The left V1 segment was incompletely imaged on the postcontrast MRA and is not visualized on the noncontrast time-of-flight MRA due to motion artifact and poor signal in this region. The left V2 and V3 segments are patent without evidence of flow limiting stenosis. IMPRESSION: 1. Unremarkable appearance of the brain for age. No acute intracranial abnormality. 2. Intracranial atherosclerosis including a moderate left P1 stenosis. 3. Motion degraded neck MRA with limited assessment of the proximal left common carotid artery and proximal left vertebral artery. No significant carotid or vertebral artery stenosis elsewhere in the neck. Electronically Signed   By: Logan Bores M.D.   On: 12/13/2018 15:54  Mr Brain Wo Contrast  Result Date: 12/13/2018 CLINICAL DATA:  Ataxia.  Dizziness and vomiting. EXAM: MR HEAD WITHOUT CONTRAST MR CIRCLE OF WILLIS WITHOUT CONTRAST MRA OF THE NECK WITHOUT AND WITH CONTRAST TECHNIQUE: Multiplanar, multiecho pulse sequences of the brain, circle of willis and surrounding structures were obtained without intravenous contrast. Angiographic images of the neck were obtained using MRA technique without and with intravenous contrast. CONTRAST:  10 mL Gadavist COMPARISON:  Head CT 02/10/2016 FINDINGS: MR HEAD FINDINGS Brain: There is no evidence of acute infarct, intracranial hemorrhage, mass, midline shift, or extra-axial fluid collection. The ventricles and sulci are normal. A few punctate foci of T2 hyperintensity in the cerebral white matter are nonspecific and not greater  than expected for age. The cerebellum is unremarkable. Vascular: Major intracranial vascular flow voids are preserved. Skull and upper cervical spine: No suspicious marrow lesion. Sinuses/Orbits: Unremarkable orbits. Mild mucosal thickening in the paranasal sinuses. No significant mastoid fluid. Other: None. MR CIRCLE OF WILLIS FINDINGS The study is mildly motion degraded. The visualized distal vertebral arteries are patent with the right being dominant. The left vertebral artery is diminutive distal to the PICA origin. The basilar artery is widely patent. There is a small left posterior communicating artery with an infundibulum at its origin. The right PCA is patent without evidence of significant proximal stenosis. There is a moderate left P1 stenosis. There is diminished signal throughout the left P2 segment which is felt to at least be partly artifactual given patency on the separate contrast-enhanced neck MRA, however assessment for an underlying stenosis is limited. The internal carotid arteries are patent from skull base to carotid termini without evidence of significant stenosis. Focal signal loss in the right ICA near the petrous-cavernous junction is artifactual given normal appearance on the neck MRA. ACAs and MCAs are patent with branch vessel irregularity and mild stenosis at the left MCA bifurcation. No aneurysm is identified. MRA NECK FINDINGS The study is mildly to moderately motion degraded. There is a standard 3 vessel aortic arch configuration. The brachiocephalic and subclavian arteries are widely patent. Apparent moderate long segment narrowing of the proximal left common carotid artery is favored to be secondary to motion artifact although an underlying stenosis is also possible. The right common carotid artery is widely patent, as are both internal carotid arteries. Antegrade flow is seen in both vertebral arteries. The right vertebral artery is moderately to strongly dominant without evidence  of significant stenosis. The left V1 segment was incompletely imaged on the postcontrast MRA and is not visualized on the noncontrast time-of-flight MRA due to motion artifact and poor signal in this region. The left V2 and V3 segments are patent without evidence of flow limiting stenosis. IMPRESSION: 1. Unremarkable appearance of the brain for age. No acute intracranial abnormality. 2. Intracranial atherosclerosis including a moderate left P1 stenosis. 3. Motion degraded neck MRA with limited assessment of the proximal left common carotid artery and proximal left vertebral artery. No significant carotid or vertebral artery stenosis elsewhere in the neck. Electronically Signed   By: Logan Bores M.D.   On: 12/13/2018 15:54    EKG: Independently reviewed. Sinus bradycardia, no acute ST/t changes  Assessment/Plan Present on Admission: **None**   Vertigo: -remain very symptomatic, continue prn antiemetics, meclizine.  -continue hydration -PT vestibular eval  sinsus bradycardia -continue tele -check tsh, keep k >4, mag>2  Obesity/OSA on cpap Body mass index is 37.6 kg/m.    DVT prophylaxis: lovenox  Consultants: PT vestibular management  Code  Status: full   Family Communication:  Patient and family at bedside  Disposition Plan: med tele, obs, likely able to go home tomorrow if symptom improves  Time spent: 76mins  Florencia Reasons MD, PhD [facility] Pager [phone] If 7PM-7AM, please contact night-coverage at [email or web address], password [facility]

## 2018-12-13 NOTE — ED Notes (Signed)
Pt transported to MRI 

## 2018-12-13 NOTE — ED Triage Notes (Signed)
Pt was at work, had a sudden onset of dizziness and nausea. Pt has been fasting for 18 hours; only drinking water.. CBG 96. BP 156/85, HR 54. No blurred vision, no weakness, no HA, denies CP/SOB. Pt continues to be dizzy. Pt has vomited twice. EMS gave 4mg  zofran.

## 2018-12-13 NOTE — ED Provider Notes (Signed)
[place] [facility] EMERGENCY DEPARTMENT Provider Note   CSN: [id number] Arrival date & time: 12/13/18  0736    History   Chief Complaint Chief Complaint  Patient presents with  . Dizziness  . Nausea    HPI Gary [name] is a 63 y.o. male.  He is presenting to the emergency department by EMS for acute onset of dizziness/room spinning while at work this morning around 7 AM.  It is associated with nausea and had 2 episodes of vomiting.  He said he had a episode of this 3 days ago that lasted a few hours.  Prior to that he is never had this before.  No recent injury no headache no numbness no weakness no chest pain no shortness of breath.  No recent travel.  No medication changes.     The history is provided by the patient.  Dizziness  Quality:  Room spinning Severity:  Severe Onset quality:  Sudden Duration:  1 hour Timing:  Constant Progression:  Unchanged Chronicity:  New Context: eye movement and head movement   Relieved by:  None tried Worsened by:  Turning head, standing up and movement Ineffective treatments:  Closing eyes and lying down Associated symptoms: nausea and vomiting   Associated symptoms: no chest pain, no diarrhea, no headaches, no hearing loss, no palpitations, no shortness of breath, no syncope, no tinnitus, no vision changes and no weakness   Risk factors: no heart disease, no hx of stroke, no Meniere's disease and no new medications     Past Medical History:  Diagnosis Date  . Diabetes-- A1c 6.7 (2016) 05/15/2015  . Leiomyosarcoma ([place]) 05/20/2015  . Lipoma of arm 2016    Patient Active Problem List   Diagnosis Date Noted  . Snoring 08/29/2018  . Erectile dysfunction 08/29/2018  . PCP NOTES >>>>>>>>>>>>>>>>>>>> 02/11/2016  . Elevated BP 02/11/2016  . Malignant tumor of soft tissue of upper limb ([place]) 08/18/2015  . Leiomyosarcoma ([place]) 05/20/2015  . Diabetes-- A1c 6.7 (2016) 05/15/2015  . Annual physical exam 06/10/2011  . Rash  06/10/2011  . LEUKOPENIA, MILD 05/26/2010    Past Surgical History:  Procedure Laterality Date  . INGUINAL HERNIA REPAIR  2000   unilateral  . MASS EXCISION Left 08/04/2015   Procedure: EXCISION OF MASS ON LEFT ARM;  Surgeon: [name], MD;  Location: [facility];  Service: General;  Laterality: Left;        Home Medications    Prior to Admission medications   Medication Sig Start Date End Date Taking? Authorizing Provider  aspirin 81 MG tablet Take 81 mg by mouth daily.    [provider]  Naproxen Sodium (ALEVE) 220 MG CAPS Aleve    [provider]  sildenafil (REVATIO) 20 MG tablet Take 3-4 tablets (60-80 mg total) by mouth at bedtime as needed. 08/29/18   Colon Branch, MD    Family History Family History  Problem Relation Age of Onset  . Hypertension Mother   . Diabetes Mother   . Colon cancer Father        <60  . Heart attack Neg Hx   . Prostate cancer Neg Hx     Social History Social History   Tobacco Use  . Smoking status: Never Smoker  . Smokeless tobacco: Never Used  Substance Use Topics  . Alcohol use: Not Currently    Alcohol/week: 0.0 standard drinks    Comment: rarely  . Drug use: No     Allergies  Patient has no known allergies.   Review of Systems Review of Systems  Constitutional: Negative for fever.  HENT: Negative for hearing loss, sore throat and tinnitus.   Eyes: Negative for visual disturbance.  Respiratory: Negative for shortness of breath.   Cardiovascular: Negative for chest pain, palpitations and syncope.  Gastrointestinal: Positive for nausea and vomiting. Negative for abdominal pain and diarrhea.  Genitourinary: Negative for dysuria.  Musculoskeletal: Negative for neck pain.  Skin: Negative for rash.  Neurological: Positive for dizziness. Negative for seizures, syncope, facial asymmetry, speech difficulty, weakness, numbness and headaches.     Physical Exam Updated Vital Signs BP (!)  138/94 (BP Location: Right Arm)   Pulse (!) 52   Temp 98.3 F (36.8 C) (Oral)   Resp 15   Ht 6\' 1"  (1.854 m)   Wt 129.3 kg   SpO2 96%   BMI 37.60 kg/m   Physical Exam Vitals signs and nursing note reviewed.  Constitutional:      Appearance: He is well-developed.  HENT:     Head: Normocephalic and atraumatic.  Eyes:     Extraocular Movements:     Right eye: Nystagmus present.     Left eye: Nystagmus present.     Conjunctiva/sclera: Conjunctivae normal.  Neck:     Musculoskeletal: Neck supple.  Cardiovascular:     Rate and Rhythm: Regular rhythm. Bradycardia present.     Pulses: Normal pulses.     Heart sounds: No murmur.  Pulmonary:     Effort: Pulmonary effort is normal. No respiratory distress.     Breath sounds: Normal breath sounds.  Abdominal:     Palpations: Abdomen is soft.     Tenderness: There is no abdominal tenderness.  Musculoskeletal: Normal range of motion.     Right lower leg: No edema.     Left lower leg: No edema.  Skin:    General: Skin is warm and dry.     Capillary Refill: Capillary refill takes less than 2 seconds.  Neurological:     General: No focal deficit present.     Mental Status: He is alert and oriented to person, place, and time.     Sensory: No sensory deficit.     Motor: No weakness.     Comments: Unable to test gait currently as patient very symptomatic and trying to remain still.      ED Treatments / Results  Labs (all labs ordered are listed, but only abnormal results are displayed) Labs Reviewed  BASIC METABOLIC PANEL - Abnormal; Notable for the following components:      Result Value   CO2 20 (*)    Glucose, Bld 129 (*)    Calcium 8.5 (*)    All other components within normal limits  CBC WITH DIFFERENTIAL/PLATELET - Abnormal; Notable for the following components:   MCH 25.9 (*)    All other components within normal limits  TROPONIN I  BASIC METABOLIC PANEL  MAGNESIUM    EKG EKG  Interpretation  Date/Time:  Thursday December 13 2018 07:47:01 EST Ventricular Rate:  48 PR Interval:    QRS Duration: 91 QT Interval:  441 QTC Calculation: 394 R Axis:   0 Text Interpretation:  Sinus bradycardia Low voltage, precordial leads Abnormal R-wave progression, early transition Left ventricular hypertrophy similar to prior 11/00 Confirmed by Aletta Edouard [phone]) on 12/13/2018 8:00:11 AM   Radiology Mr Jodene Nam Head Wo Contrast  Result Date: 12/13/2018 CLINICAL DATA:  Ataxia.  Dizziness and vomiting. EXAM: MR HEAD  WITHOUT CONTRAST MR CIRCLE OF WILLIS WITHOUT CONTRAST MRA OF THE NECK WITHOUT AND WITH CONTRAST TECHNIQUE: Multiplanar, multiecho pulse sequences of the brain, circle of willis and surrounding structures were obtained without intravenous contrast. Angiographic images of the neck were obtained using MRA technique without and with intravenous contrast. CONTRAST:  10 mL Gadavist COMPARISON:  Head CT 02/10/2016 FINDINGS: MR HEAD FINDINGS Brain: There is no evidence of acute infarct, intracranial hemorrhage, mass, midline shift, or extra-axial fluid collection. The ventricles and sulci are normal. A few punctate foci of T2 hyperintensity in the cerebral white matter are nonspecific and not greater than expected for age. The cerebellum is unremarkable. Vascular: Major intracranial vascular flow voids are preserved. Skull and upper cervical spine: No suspicious marrow lesion. Sinuses/Orbits: Unremarkable orbits. Mild mucosal thickening in the paranasal sinuses. No significant mastoid fluid. Other: None. MR CIRCLE OF WILLIS FINDINGS The study is mildly motion degraded. The visualized distal vertebral arteries are patent with the right being dominant. The left vertebral artery is diminutive distal to the PICA origin. The basilar artery is widely patent. There is a small left posterior communicating artery with an infundibulum at its origin. The right PCA is patent without evidence of significant  proximal stenosis. There is a moderate left P1 stenosis. There is diminished signal throughout the left P2 segment which is felt to at least be partly artifactual given patency on the separate contrast-enhanced neck MRA, however assessment for an underlying stenosis is limited. The internal carotid arteries are patent from skull base to carotid termini without evidence of significant stenosis. Focal signal loss in the right ICA near the petrous-cavernous junction is artifactual given normal appearance on the neck MRA. ACAs and MCAs are patent with branch vessel irregularity and mild stenosis at the left MCA bifurcation. No aneurysm is identified. MRA NECK FINDINGS The study is mildly to moderately motion degraded. There is a standard 3 vessel aortic arch configuration. The brachiocephalic and subclavian arteries are widely patent. Apparent moderate long segment narrowing of the proximal left common carotid artery is favored to be secondary to motion artifact although an underlying stenosis is also possible. The right common carotid artery is widely patent, as are both internal carotid arteries. Antegrade flow is seen in both vertebral arteries. The right vertebral artery is moderately to strongly dominant without evidence of significant stenosis. The left V1 segment was incompletely imaged on the postcontrast MRA and is not visualized on the noncontrast time-of-flight MRA due to motion artifact and poor signal in this region. The left V2 and V3 segments are patent without evidence of flow limiting stenosis. IMPRESSION: 1. Unremarkable appearance of the brain for age. No acute intracranial abnormality. 2. Intracranial atherosclerosis including a moderate left P1 stenosis. 3. Motion degraded neck MRA with limited assessment of the proximal left common carotid artery and proximal left vertebral artery. No significant carotid or vertebral artery stenosis elsewhere in the neck. Electronically Signed   By: Logan Bores  M.D.   On: 12/13/2018 15:54   Mr Angiogram Neck W Or Wo Contrast  Result Date: 12/13/2018 CLINICAL DATA:  Ataxia.  Dizziness and vomiting. EXAM: MR HEAD WITHOUT CONTRAST MR CIRCLE OF WILLIS WITHOUT CONTRAST MRA OF THE NECK WITHOUT AND WITH CONTRAST TECHNIQUE: Multiplanar, multiecho pulse sequences of the brain, circle of willis and surrounding structures were obtained without intravenous contrast. Angiographic images of the neck were obtained using MRA technique without and with intravenous contrast. CONTRAST:  10 mL Gadavist COMPARISON:  Head CT 02/10/2016 FINDINGS: MR HEAD  FINDINGS Brain: There is no evidence of acute infarct, intracranial hemorrhage, mass, midline shift, or extra-axial fluid collection. The ventricles and sulci are normal. A few punctate foci of T2 hyperintensity in the cerebral white matter are nonspecific and not greater than expected for age. The cerebellum is unremarkable. Vascular: Major intracranial vascular flow voids are preserved. Skull and upper cervical spine: No suspicious marrow lesion. Sinuses/Orbits: Unremarkable orbits. Mild mucosal thickening in the paranasal sinuses. No significant mastoid fluid. Other: None. MR CIRCLE OF WILLIS FINDINGS The study is mildly motion degraded. The visualized distal vertebral arteries are patent with the right being dominant. The left vertebral artery is diminutive distal to the PICA origin. The basilar artery is widely patent. There is a small left posterior communicating artery with an infundibulum at its origin. The right PCA is patent without evidence of significant proximal stenosis. There is a moderate left P1 stenosis. There is diminished signal throughout the left P2 segment which is felt to at least be partly artifactual given patency on the separate contrast-enhanced neck MRA, however assessment for an underlying stenosis is limited. The internal carotid arteries are patent from skull base to carotid termini without evidence of  significant stenosis. Focal signal loss in the right ICA near the petrous-cavernous junction is artifactual given normal appearance on the neck MRA. ACAs and MCAs are patent with branch vessel irregularity and mild stenosis at the left MCA bifurcation. No aneurysm is identified. MRA NECK FINDINGS The study is mildly to moderately motion degraded. There is a standard 3 vessel aortic arch configuration. The brachiocephalic and subclavian arteries are widely patent. Apparent moderate long segment narrowing of the proximal left common carotid artery is favored to be secondary to motion artifact although an underlying stenosis is also possible. The right common carotid artery is widely patent, as are both internal carotid arteries. Antegrade flow is seen in both vertebral arteries. The right vertebral artery is moderately to strongly dominant without evidence of significant stenosis. The left V1 segment was incompletely imaged on the postcontrast MRA and is not visualized on the noncontrast time-of-flight MRA due to motion artifact and poor signal in this region. The left V2 and V3 segments are patent without evidence of flow limiting stenosis. IMPRESSION: 1. Unremarkable appearance of the brain for age. No acute intracranial abnormality. 2. Intracranial atherosclerosis including a moderate left P1 stenosis. 3. Motion degraded neck MRA with limited assessment of the proximal left common carotid artery and proximal left vertebral artery. No significant carotid or vertebral artery stenosis elsewhere in the neck. Electronically Signed   By: Logan Bores M.D.   On: 12/13/2018 15:54   Mr Brain Wo Contrast  Result Date: 12/13/2018 CLINICAL DATA:  Ataxia.  Dizziness and vomiting. EXAM: MR HEAD WITHOUT CONTRAST MR CIRCLE OF WILLIS WITHOUT CONTRAST MRA OF THE NECK WITHOUT AND WITH CONTRAST TECHNIQUE: Multiplanar, multiecho pulse sequences of the brain, circle of willis and surrounding structures were obtained without  intravenous contrast. Angiographic images of the neck were obtained using MRA technique without and with intravenous contrast. CONTRAST:  10 mL Gadavist COMPARISON:  Head CT 02/10/2016 FINDINGS: MR HEAD FINDINGS Brain: There is no evidence of acute infarct, intracranial hemorrhage, mass, midline shift, or extra-axial fluid collection. The ventricles and sulci are normal. A few punctate foci of T2 hyperintensity in the cerebral white matter are nonspecific and not greater than expected for age. The cerebellum is unremarkable. Vascular: Major intracranial vascular flow voids are preserved. Skull and upper cervical spine: No suspicious marrow lesion.  Sinuses/Orbits: Unremarkable orbits. Mild mucosal thickening in the paranasal sinuses. No significant mastoid fluid. Other: None. MR CIRCLE OF WILLIS FINDINGS The study is mildly motion degraded. The visualized distal vertebral arteries are patent with the right being dominant. The left vertebral artery is diminutive distal to the PICA origin. The basilar artery is widely patent. There is a small left posterior communicating artery with an infundibulum at its origin. The right PCA is patent without evidence of significant proximal stenosis. There is a moderate left P1 stenosis. There is diminished signal throughout the left P2 segment which is felt to at least be partly artifactual given patency on the separate contrast-enhanced neck MRA, however assessment for an underlying stenosis is limited. The internal carotid arteries are patent from skull base to carotid termini without evidence of significant stenosis. Focal signal loss in the right ICA near the petrous-cavernous junction is artifactual given normal appearance on the neck MRA. ACAs and MCAs are patent with branch vessel irregularity and mild stenosis at the left MCA bifurcation. No aneurysm is identified. MRA NECK FINDINGS The study is mildly to moderately motion degraded. There is a standard 3 vessel aortic arch  configuration. The brachiocephalic and subclavian arteries are widely patent. Apparent moderate long segment narrowing of the proximal left common carotid artery is favored to be secondary to motion artifact although an underlying stenosis is also possible. The right common carotid artery is widely patent, as are both internal carotid arteries. Antegrade flow is seen in both vertebral arteries. The right vertebral artery is moderately to strongly dominant without evidence of significant stenosis. The left V1 segment was incompletely imaged on the postcontrast MRA and is not visualized on the noncontrast time-of-flight MRA due to motion artifact and poor signal in this region. The left V2 and V3 segments are patent without evidence of flow limiting stenosis. IMPRESSION: 1. Unremarkable appearance of the brain for age. No acute intracranial abnormality. 2. Intracranial atherosclerosis including a moderate left P1 stenosis. 3. Motion degraded neck MRA with limited assessment of the proximal left common carotid artery and proximal left vertebral artery. No significant carotid or vertebral artery stenosis elsewhere in the neck. Electronically Signed   By: Logan Bores M.D.   On: 12/13/2018 15:54    Procedures Procedures (including critical care time)  Medications Ordered in ED Medications  LORazepam (ATIVAN) injection 1 mg (has no administration in time range)  sodium chloride 0.9 % bolus 1,000 mL (has no administration in time range)     Initial Impression / Assessment and Plan / ED Course  I have reviewed the triage vital signs and the nursing notes.  Pertinent labs & imaging results that were available during my care of the patient were reviewed by me and considered in my medical decision making (see chart for details).  Clinical Course as of Dec 12 1736  Thu Dec 12, 8856  7425 63 year old male with sudden onset of room spinning sensation.  He is very symptomatic now he can barely open his eyes so  this is limiting his neuro evaluation.   [MB]  [id number] Patient slightly better with medication.  He is able to open his eyes now but about it.  He is got normal finger-to-nose and no pronator drift.  I am going to put him in for an MRI and try him with some oral meclizine.   [MB]  1204 Patient over an MRI now and is vomiting.  Nurses coming over and give him some Phenergan to see if we can  get him to be more comfortable get his test done   [MB]  1549 Called over to the reading room and they are still reading his studies.   [MB]  1556 Vertigo, MRIs pending, home if neg and able to walk   [MB-2]  1600 Patient's MRI imaging showing no acute findings.  Plan will be to get the patient up and see if he is safe to ambulate.  If so he can be discharged on meclizine.  If not he will have to be admitted for further evaluation.   [MB]    Clinical Course User Index [MB] Hayden Rasmussen, MD [MB-2] Maudie Flakes, MD        Final Clinical Impressions(s) / ED Diagnoses   Final diagnoses:  Peripheral vertigo, unspecified laterality    ED Discharge Orders    None       Hayden Rasmussen, MD 12/13/18 [phone]

## 2018-12-13 NOTE — Progress Notes (Signed)
Pt has home machine and mask.  Family at bedside.  Pt resting comfortably with CPAP in place.  No issues noted at time of check.

## 2018-12-13 NOTE — ED Provider Notes (Signed)
  Provider Note MRN:  [id number]  Arrival date & time: 12/13/18    ED Course and Medical Decision Making  I received sign out for this patient at shift change from Dr. Melina Copa.  MRIs with no acute process.  Upon ambulatory assessment, patient nearly fell after attempting first step, still very symptomatic.  Admitted to hospital service for further care.  Barth Kirks. Sedonia Small, Maguayo [email or web address]    Maudie Flakes, MD 12/13/18 1700

## 2018-12-14 ENCOUNTER — Other Ambulatory Visit: Payer: Self-pay

## 2018-12-14 DIAGNOSIS — H81399 Other peripheral vertigo, unspecified ear: Secondary | ICD-10-CM | POA: Diagnosis not present

## 2018-12-14 LAB — BASIC METABOLIC PANEL
Anion gap: 5 (ref 5–15)
BUN: 15 mg/dL (ref 6–20)
CO2: 20 mmol/L — ABNORMAL LOW (ref 22–32)
Calcium: 7.7 mg/dL — ABNORMAL LOW (ref 8.9–10.3)
Chloride: 117 mmol/L — ABNORMAL HIGH (ref 98–111)
Creatinine, Ser: 1.08 mg/dL (ref 0.61–1.24)
GFR calc Af Amer: >60 mL/min (ref >60)
Glucose, Bld: 104 mg/dL — ABNORMAL HIGH (ref 70–99)
Potassium: 3.9 mmol/L (ref 3.5–5.1)
Sodium: 142 mmol/L (ref 135–145)

## 2018-12-14 LAB — MAGNESIUM: Magnesium: 1.7 mg/dL (ref 1.7–2.4)

## 2018-12-14 LAB — TSH: TSH: 0.398 u[IU]/mL (ref 0.350–4.500)

## 2018-12-14 LAB — HIV ANTIBODY (ROUTINE TESTING W REFLEX): HIV Screen 4th Generation wRfx: NONREACTIVE

## 2018-12-14 MED ORDER — ENOXAPARIN SODIUM 80 MG/0.8ML ~~LOC~~ SOLN: 65.0000 mg | SUBCUTANEOUS | Status: DC

## 2018-12-14 MED ORDER — MECLIZINE HCL 12.5 MG PO TABS: 12.5000 mg | ORAL_TABLET | Freq: Three times a day (TID) | ORAL | 0 refills | Status: DC | PRN

## 2018-12-14 NOTE — Care Management Note (Signed)
Case Management Note  Patient Details  Name: Gary [name] MRN: [id number] Date of Birth: 08/28/1956  Subjective/Objective: 63 yo male presented with dizziness. PMH:  Vertigo, sinus brady and Obesity.                 Action/Plan: CM consult acknowledged for outpatient PT. CM met with patient to discuss transitional needs. Patient lives at home with his family, employed and was independent with his ADLs PTA. PCP: Dr. Kathlene November. CM met with patient to discuss outpatient rehab options, with referral initiated in Epic for the [facility]; AVS updated. No further needs from CM.   Expected Discharge Date:  12/14/18               Expected Discharge Plan:  OP Rehab  In-House Referral:  NA  Discharge planning Services  CM Consult  Post Acute Care Choice:  NA Choice offered to:  NA  DME Arranged:  N/A DME Agency:  NA  HH Arranged:  NA HH Agency:  NA  Status of Service:  Completed, signed off  If discussed at [place] of Stay Meetings, dates discussed:    Additional Comments:  Midge Minium RN, BSN, NCM-BC, ACM-RN [phone] 12/14/2018, 11:36 AM

## 2018-12-14 NOTE — Discharge Instructions (Signed)
No driving until cleared by outpatient vestibular PT

## 2018-12-14 NOTE — Evaluation (Signed)
Physical Therapy Evaluation Patient Details Name: Gary [name] MRN: [id number] DOB: 1956-05-24 Today's Date: 12/14/2018   History of Present Illness  Pt has no prior medical history other than OSA on cpap, had a sudden onset of dizziness/room spinning and nausea while at work day of admit. Pt had been fasting for 18 hours; only drinking water. CBG 96. BP 156/85, HR 54. No blurred vision, no weakness, no HA, denies CP/SOB. Pt continues to be dizzy. Pt has vomited twice. EMS gave 4mg  zofran.  Clinical Impression  Pt admitted with above diagnosis. Pt currently with functional limitations due to the deficits listed below (see PT Problem List). Pt tested positive for left horizontal canal BPPV and treated with BBQ roll.  Pt reported decr dizziness after treatment. Pt was able to ambulate with supervision and cues without device.  Will follow acutely to ensure that BPPV is fully resolved.   Pt will benefit from skilled PT to increase their independence and safety with mobility to allow discharge to the venue listed below.      Follow Up Recommendations Outpatient PT;Supervision - Intermittent(for vestibular rehab)    Equipment Recommendations  None recommended by PT    Recommendations for Other Services       Precautions / Restrictions Precautions Precautions: Fall Restrictions Weight Bearing Restrictions: No      Mobility  Bed Mobility Overal bed mobility: Independent                Transfers Overall transfer level: Independent                  Ambulation/Gait Ambulation/Gait assistance: Supervision Gait Distance (Feet): 70 Feet Assistive device: None Gait Pattern/deviations: Step-through pattern;Decreased stride length   Gait velocity interpretation: <1.31 ft/sec, indicative of household ambulator General Gait Details: Pt was able to ambulate without device in room with good stability overall.  Did not lose Balance and dizziness not reported after completing the  maneuver at beginning of session.   Stairs            Wheelchair Mobility    Modified Rankin (Stroke Patients Only)       Balance Overall balance assessment: Needs assistance Sitting-balance support: No upper extremity supported;Feet supported Sitting balance-Leahy Scale: Fair     Standing balance support: No upper extremity supported;During functional activity Standing balance-Leahy Scale: Fair Standing balance comment: can stand statically and maintain balance.                              Pertinent Vitals/Pain Pain Assessment: No/denies pain    Home Living Family/patient expects to be discharged to:: Private residence Living Arrangements: Spouse/significant other;Children Available Help at Discharge: Family Type of Home: House Home Access: Stairs to enter Entrance Stairs-Rails: Left Entrance Stairs-Number of Steps: 3 Home Layout: One level Home Equipment: None Additional Comments: Works for [employer]; son and daughter are in school.     Prior Function Level of Independence: Independent               Hand Dominance        Extremity/Trunk Assessment   Upper Extremity Assessment Upper Extremity Assessment: Defer to OT evaluation    Lower Extremity Assessment Lower Extremity Assessment: Generalized weakness    Cervical / Trunk Assessment Cervical / Trunk Assessment: Normal  Communication   Communication: No difficulties  Cognition Arousal/Alertness: Awake/alert Behavior During Therapy: WFL for tasks assessed/performed Overall Cognitive Status:  Within Functional Limits for tasks assessed                                        General Comments General comments (skin integrity, edema, etc.): Tested for BPPV with negative testing for posterior and anterior canals but positive for left horizontal canal BPPV.  Treated with BBQ roll. Pt reports feeling less dizzy after treatment.      Exercises Other Exercises Other Exercises: brandt daroff exercise program given to pt Other Exercises: BPPV explanation handout given as well.    Assessment/Plan    PT Assessment Patient needs continued PT services  PT Problem List Decreased mobility;Decreased balance;Decreased activity tolerance;Decreased knowledge of use of DME;Decreased safety awareness;Decreased knowledge of precautions(BPPV)       PT Treatment Interventions DME instruction;Gait training;Functional mobility training;Therapeutic activities;Therapeutic exercise;Balance training;Stair training;Patient/family education(BBQ roll)    PT Goals (Current goals can be found in the Care Plan section)  Acute Rehab PT Goals Patient Stated Goal: to go home PT Goal Formulation: With patient Time For Goal Achievement: 12/28/18 Potential to Achieve Goals: Good    Frequency Min 3X/week   Barriers to discharge        Co-evaluation               AM-PAC PT "6 Clicks" Mobility  Outcome Measure Help needed turning from your back to your side while in a flat bed without using bedrails?: None Help needed moving from lying on your back to sitting on the side of a flat bed without using bedrails?: None Help needed moving to and from a bed to a chair (including a wheelchair)?: None Help needed standing up from a chair using your arms (e.g., wheelchair or bedside chair)?: None Help needed to walk in hospital room?: A Little Help needed climbing 3-5 steps with a railing? : A Little 6 Click Score: 22    End of Session Equipment Utilized During Treatment: Gait belt Activity Tolerance: Patient limited by fatigue Patient left: in bed;with call bell/phone within reach;with family/visitor present Nurse Communication: Mobility status PT Visit Diagnosis: Unsteadiness on feet (R26.81);BPPV BPPV - Right/Left : Left    Time: 8003-4917 PT Time Calculation (min) (ACUTE ONLY): 42 min   Charges:   PT Evaluation $PT Eval Moderate  Complexity: 1 Mod PT Treatments $Gait Training: 8-22 mins $Canalith Rep Proc: 8-22 mins        Desirae Mancusi,PT Acute Rehabilitation Services Pager:  [phone]  Office:  [place] 12/14/2018, 10:38 AM

## 2018-12-14 NOTE — Discharge Summary (Signed)
Physician Discharge Summary  Gary [name] GYJ:[id number] DOB: 1956/06/02 DOA: 12/13/2018  PCP: Colon Branch, MD  Admit date: 12/13/2018 Discharge date: 12/14/2018  Admitted From: home Discharge disposition: home   Recommendations for Outpatient Follow-Up:   1. Outpatient vestibular PT   Discharge Diagnosis:   Active Problems:   Vertigo    Discharge Condition: Improved.  Diet recommendation: Low sodium, heart healthy  Wound care: None.  Code status: Full.   History of Present Illness:   Pt has no prior medical history other than OSA on cpap, had a sudden onset of dizziness/room spinning and nausea while at work this am. Pt has been fasting for 18 hours; only drinking water. CBG 96. BP 156/85, HR 54. No blurred vision, no weakness, no HA, denies CP/SOB. Pt continues to be dizzy. Pt has vomited twice. EMS gave 4mg  zofran.he is brought to the ED.  ED course: has sinus bradycardia, heart rate in the 50's, bp stable, no fever, no hypoxia, has significant nystagmus on exam,  mri brain no acute findings, basic labs unremarkable. He received antiemetics and meclizine, However, he remains very symptomatic,  patient nearly fell after attempting first step, hospitalist called to further manage the patient.  He does reports chronic tinnitus,  no recent illness, he does not take any meds regularly.    Hospital Course by Problem:   Vertigo: -symptoms improved today -prn meclizine -encouraged hydration  sinsus bradycardia -stable  Obesity/OSA on cpap Estimated body mass index is 37.6 kg/m as calculated from the following:   Height as of this encounter: 6\' 1"  (1.854 m).   Weight as of this encounter: 129.3 kg. -weight loss encouraged   Medical Consultants:      Discharge Exam:   Vitals:   12/14/18 0441 12/14/18 1115  BP: (!) 134/97 (!) 145/92  Pulse: 61 (!) 55  Resp: 18 16  Temp: 98.8 F (37.1 C) 98.9 F (37.2 C)  SpO2: 98% 97%   Vitals:   12/13/18  1830 12/13/18 2032 12/14/18 0441 12/14/18 1115  BP: 117/73 (!) 130/93 (!) 134/97 (!) 145/92  Pulse: (!) 56 (!) 56 61 (!) 55  Resp:  18 18 16   Temp:  98.2 F (36.8 C) 98.8 F (37.1 C) 98.9 F (37.2 C)  TempSrc:  Oral Oral Oral  SpO2: 100% 95% 98% 97%  Weight:      Height:        General exam: Appears calm and comfortable. No further dizziness  The results of significant diagnostics from this hospitalization (including imaging, microbiology, ancillary and laboratory) are listed below for reference.     Procedures and Diagnostic Studies:   Mr Gary [name] YO Contrast  Result Date: 12/13/2018 CLINICAL DATA:  Ataxia.  Dizziness and vomiting. EXAM: MR HEAD WITHOUT CONTRAST MR CIRCLE OF WILLIS WITHOUT CONTRAST MRA OF THE NECK WITHOUT AND WITH CONTRAST TECHNIQUE: Multiplanar, multiecho pulse sequences of the brain, circle of willis and surrounding structures were obtained without intravenous contrast. Angiographic images of the neck were obtained using MRA technique without and with intravenous contrast. CONTRAST:  10 mL Gadavist COMPARISON:  Head CT 02/10/2016 FINDINGS: MR HEAD FINDINGS Brain: There is no evidence of acute infarct, intracranial hemorrhage, mass, midline shift, or extra-axial fluid collection. The ventricles and sulci are normal. A few punctate foci of T2 hyperintensity in the cerebral white matter are nonspecific and not greater than expected for age. The cerebellum is unremarkable. Vascular: Major intracranial vascular flow voids are preserved. Skull and upper  cervical spine: No suspicious marrow lesion. Sinuses/Orbits: Unremarkable orbits. Mild mucosal thickening in the paranasal sinuses. No significant mastoid fluid. Other: None. MR CIRCLE OF WILLIS FINDINGS The study is mildly motion degraded. The visualized distal vertebral arteries are patent with the right being dominant. The left vertebral artery is diminutive distal to the PICA origin. The basilar artery is widely patent. There  is a small left posterior communicating artery with an infundibulum at its origin. The right PCA is patent without evidence of significant proximal stenosis. There is a moderate left P1 stenosis. There is diminished signal throughout the left P2 segment which is felt to at least be partly artifactual given patency on the separate contrast-enhanced neck MRA, however assessment for an underlying stenosis is limited. The internal carotid arteries are patent from skull base to carotid termini without evidence of significant stenosis. Focal signal loss in the right ICA near the petrous-cavernous junction is artifactual given normal appearance on the neck MRA. ACAs and MCAs are patent with branch vessel irregularity and mild stenosis at the left MCA bifurcation. No aneurysm is identified. MRA NECK FINDINGS The study is mildly to moderately motion degraded. There is a standard 3 vessel aortic arch configuration. The brachiocephalic and subclavian arteries are widely patent. Apparent moderate long segment narrowing of the proximal left common carotid artery is favored to be secondary to motion artifact although an underlying stenosis is also possible. The right common carotid artery is widely patent, as are both internal carotid arteries. Antegrade flow is seen in both vertebral arteries. The right vertebral artery is moderately to strongly dominant without evidence of significant stenosis. The left V1 segment was incompletely imaged on the postcontrast MRA and is not visualized on the noncontrast time-of-flight MRA due to motion artifact and poor signal in this region. The left V2 and V3 segments are patent without evidence of flow limiting stenosis. IMPRESSION: 1. Unremarkable appearance of the brain for age. No acute intracranial abnormality. 2. Intracranial atherosclerosis including a moderate left P1 stenosis. 3. Motion degraded neck MRA with limited assessment of the proximal left common carotid artery and proximal  left vertebral artery. No significant carotid or vertebral artery stenosis elsewhere in the neck. Electronically Signed   By: Logan Bores M.D.   On: 12/13/2018 15:54   Mr Angiogram Neck W Or Wo Contrast  Result Date: 12/13/2018 CLINICAL DATA:  Ataxia.  Dizziness and vomiting. EXAM: MR HEAD WITHOUT CONTRAST MR CIRCLE OF WILLIS WITHOUT CONTRAST MRA OF THE NECK WITHOUT AND WITH CONTRAST TECHNIQUE: Multiplanar, multiecho pulse sequences of the brain, circle of willis and surrounding structures were obtained without intravenous contrast. Angiographic images of the neck were obtained using MRA technique without and with intravenous contrast. CONTRAST:  10 mL Gadavist COMPARISON:  Head CT 02/10/2016 FINDINGS: MR HEAD FINDINGS Brain: There is no evidence of acute infarct, intracranial hemorrhage, mass, midline shift, or extra-axial fluid collection. The ventricles and sulci are normal. A few punctate foci of T2 hyperintensity in the cerebral white matter are nonspecific and not greater than expected for age. The cerebellum is unremarkable. Vascular: Major intracranial vascular flow voids are preserved. Skull and upper cervical spine: No suspicious marrow lesion. Sinuses/Orbits: Unremarkable orbits. Mild mucosal thickening in the paranasal sinuses. No significant mastoid fluid. Other: None. MR CIRCLE OF WILLIS FINDINGS The study is mildly motion degraded. The visualized distal vertebral arteries are patent with the right being dominant. The left vertebral artery is diminutive distal to the PICA origin. The basilar artery is widely patent. There  is a small left posterior communicating artery with an infundibulum at its origin. The right PCA is patent without evidence of significant proximal stenosis. There is a moderate left P1 stenosis. There is diminished signal throughout the left P2 segment which is felt to at least be partly artifactual given patency on the separate contrast-enhanced neck MRA, however assessment for  an underlying stenosis is limited. The internal carotid arteries are patent from skull base to carotid termini without evidence of significant stenosis. Focal signal loss in the right ICA near the petrous-cavernous junction is artifactual given normal appearance on the neck MRA. ACAs and MCAs are patent with branch vessel irregularity and mild stenosis at the left MCA bifurcation. No aneurysm is identified. MRA NECK FINDINGS The study is mildly to moderately motion degraded. There is a standard 3 vessel aortic arch configuration. The brachiocephalic and subclavian arteries are widely patent. Apparent moderate long segment narrowing of the proximal left common carotid artery is favored to be secondary to motion artifact although an underlying stenosis is also possible. The right common carotid artery is widely patent, as are both internal carotid arteries. Antegrade flow is seen in both vertebral arteries. The right vertebral artery is moderately to strongly dominant without evidence of significant stenosis. The left V1 segment was incompletely imaged on the postcontrast MRA and is not visualized on the noncontrast time-of-flight MRA due to motion artifact and poor signal in this region. The left V2 and V3 segments are patent without evidence of flow limiting stenosis. IMPRESSION: 1. Unremarkable appearance of the brain for age. No acute intracranial abnormality. 2. Intracranial atherosclerosis including a moderate left P1 stenosis. 3. Motion degraded neck MRA with limited assessment of the proximal left common carotid artery and proximal left vertebral artery. No significant carotid or vertebral artery stenosis elsewhere in the neck. Electronically Signed   By: Logan Bores M.D.   On: 12/13/2018 15:54   Mr Brain Wo Contrast  Result Date: 12/13/2018 CLINICAL DATA:  Ataxia.  Dizziness and vomiting. EXAM: MR HEAD WITHOUT CONTRAST MR CIRCLE OF WILLIS WITHOUT CONTRAST MRA OF THE NECK WITHOUT AND WITH CONTRAST  TECHNIQUE: Multiplanar, multiecho pulse sequences of the brain, circle of willis and surrounding structures were obtained without intravenous contrast. Angiographic images of the neck were obtained using MRA technique without and with intravenous contrast. CONTRAST:  10 mL Gadavist COMPARISON:  Head CT 02/10/2016 FINDINGS: MR HEAD FINDINGS Brain: There is no evidence of acute infarct, intracranial hemorrhage, mass, midline shift, or extra-axial fluid collection. The ventricles and sulci are normal. A few punctate foci of T2 hyperintensity in the cerebral white matter are nonspecific and not greater than expected for age. The cerebellum is unremarkable. Vascular: Major intracranial vascular flow voids are preserved. Skull and upper cervical spine: No suspicious marrow lesion. Sinuses/Orbits: Unremarkable orbits. Mild mucosal thickening in the paranasal sinuses. No significant mastoid fluid. Other: None. MR CIRCLE OF WILLIS FINDINGS The study is mildly motion degraded. The visualized distal vertebral arteries are patent with the right being dominant. The left vertebral artery is diminutive distal to the PICA origin. The basilar artery is widely patent. There is a small left posterior communicating artery with an infundibulum at its origin. The right PCA is patent without evidence of significant proximal stenosis. There is a moderate left P1 stenosis. There is diminished signal throughout the left P2 segment which is felt to at least be partly artifactual given patency on the separate contrast-enhanced neck MRA, however assessment for an underlying stenosis is limited. The  internal carotid arteries are patent from skull base to carotid termini without evidence of significant stenosis. Focal signal loss in the right ICA near the petrous-cavernous junction is artifactual given normal appearance on the neck MRA. ACAs and MCAs are patent with branch vessel irregularity and mild stenosis at the left MCA bifurcation. No  aneurysm is identified. MRA NECK FINDINGS The study is mildly to moderately motion degraded. There is a standard 3 vessel aortic arch configuration. The brachiocephalic and subclavian arteries are widely patent. Apparent moderate long segment narrowing of the proximal left common carotid artery is favored to be secondary to motion artifact although an underlying stenosis is also possible. The right common carotid artery is widely patent, as are both internal carotid arteries. Antegrade flow is seen in both vertebral arteries. The right vertebral artery is moderately to strongly dominant without evidence of significant stenosis. The left V1 segment was incompletely imaged on the postcontrast MRA and is not visualized on the noncontrast time-of-flight MRA due to motion artifact and poor signal in this region. The left V2 and V3 segments are patent without evidence of flow limiting stenosis. IMPRESSION: 1. Unremarkable appearance of the brain for age. No acute intracranial abnormality. 2. Intracranial atherosclerosis including a moderate left P1 stenosis. 3. Motion degraded neck MRA with limited assessment of the proximal left common carotid artery and proximal left vertebral artery. No significant carotid or vertebral artery stenosis elsewhere in the neck. Electronically Signed   By: Logan Bores M.D.   On: 12/13/2018 15:54     Labs:   Basic Metabolic Panel: Recent Labs  Lab 12/13/18 0804 12/14/18 0328  NA 139 142  K 3.6 3.9  CL 106 117*  CO2 20* 20*  GLUCOSE 129* 104*  BUN 13 15  CREATININE 1.22 1.08  CALCIUM 8.5* 7.7*  MG  --  1.7   GFR Estimated Creatinine Clearance: 102.6 mL/min (by C-G formula based on SCr of 1.08 mg/dL). Liver Function Tests: No results for input(s): AST, ALT, ALKPHOS, BILITOT, PROT, ALBUMIN in the last 168 hours. No results for input(s): LIPASE, AMYLASE in the last 168 hours. No results for input(s): AMMONIA in the last 168 hours. Coagulation profile No results for  input(s): INR, PROTIME in the last 168 hours.  CBC: Recent Labs  Lab 12/13/18 0804  WBC 5.7  NEUTROABS 3.2  HGB 13.9  HCT 45.3  MCV 84.4  PLT 249   Cardiac Enzymes: Recent Labs  Lab 12/13/18 0804  TROPONINI <0.03   BNP: Invalid input(s): POCBNP CBG: No results for input(s): GLUCAP in the last 168 hours. D-Dimer No results for input(s): DDIMER in the last 72 hours. Hgb A1c No results for input(s): HGBA1C in the last 72 hours. Lipid Profile No results for input(s): CHOL, HDL, LDLCALC, TRIG, CHOLHDL, LDLDIRECT in the last 72 hours. Thyroid function studies Recent Labs    12/14/18 0328  TSH 0.398   Anemia work up No results for input(s): VITAMINB12, FOLATE, FERRITIN, TIBC, IRON, RETICCTPCT in the last 72 hours. Microbiology No results found for this or any previous visit (from the past 240 hour(s)).   Discharge Instructions:   Discharge Instructions    Ambulatory referral to Physical Therapy   Complete by:  As directed    Vestibular rehab   Diet general   Complete by:  As directed    Increase activity slowly   Complete by:  As directed      Allergies as of 12/14/2018   No Known Allergies  Medication List    STOP taking these medications   ALEVE PO   sildenafil 20 MG tablet Commonly known as:  REVATIO     TAKE these medications   meclizine 12.5 MG tablet Commonly known as:  ANTIVERT Take 1 tablet (12.5 mg total) by mouth 3 (three) times daily as needed for dizziness.      Follow-up Information    Colon Branch, MD. Schedule an appointment as soon as possible for a visit in 1 week(s).   Specialty:  Internal Medicine Why:  For recheck of your symptoms Contact information: [address] [place] [address] [phone]        [facility]. Go to.   Specialty:  Rehabilitation Why:  for your outpatient vestibular physical therapy rehabilitation. The scheduler will contact you in 2-3  business days. Please contact the number listed above if you don't receive a call for scheduling.  Contact information: [address] [place] [id number] [place] [address] [phone]           Time coordinating discharge: 25 min  Signed:  Geradine Girt DO  [facility] 12/14/2018, 1:42 PM

## 2018-12-14 NOTE — Progress Notes (Signed)
Pt stood at bedside.  No dizziness.  Feels slightly funny on rt side of head.  But much improved.  Not feeling like he will fall over.

## 2018-12-17 ENCOUNTER — Telehealth: Payer: Self-pay | Admitting: *Deleted

## 2018-12-17 ENCOUNTER — Encounter: Payer: Self-pay | Admitting: Internal Medicine

## 2018-12-17 ENCOUNTER — Ambulatory Visit: Payer: 59 | Admitting: Internal Medicine

## 2018-12-17 VITALS — BP 126/78 | HR 55 | Temp 97.9°F | Resp 16 | Ht 73.0 in | Wt 283.1 lb

## 2018-12-17 DIAGNOSIS — R42 Dizziness and giddiness: Secondary | ICD-10-CM

## 2018-12-17 NOTE — Progress Notes (Signed)
Pre visit review using our clinic review tool, if applicable. No additional management support is needed unless otherwise documented below in the visit note. 

## 2018-12-17 NOTE — Telephone Encounter (Signed)
No answer

## 2018-12-17 NOTE — Progress Notes (Signed)
Subjective:    Patient ID: Gary [name], male    DOB: May 25, 1956, 63 y.o.   MRN: [id number]  DOS:  12/17/2018 Type of visit - description: TCM 7 Patient was admitted to the hospital and discharge 12/14/2018, he presented with sudden onset of dizziness, spinning, nausea. excerpt from H&P:  Pt has no prior medical history other than OSA on cpap, had a sudden onset of dizziness/room spinning and nausea while at work this am. Pt has been fasting for 18 hours; only drinking water. CBG 96. BP 156/85, HR 54. No blurred vision, no weakness, no HA, denies CP/SOB. Pt continues to be dizzy. Pt has vomited twice. EMS gave 4mg  zofran.he is brought to the ED.  ED course: has sinus bradycardia, heart rate in the 50's, bp stable, no fever, no hypoxia, has significant nystagmus on exam,  mri brain no acute findings, basic labs unremarkable. He received antiemetics and meclizine, However, he remains very symptomatic,  patient nearly fell after attempting first step, hospitalist called to further manage the patient.    Last blood: K+3.9, creatinine is stable, CBC normal, TSH normal..  Review of Systems Today, the patient confirms the history:   he was feeling well until he gradually developed the spinning without associated symptoms such as diplopia, slurred speech or motor deficits.No headaches.  Since he left the hospital he is feeling better.  No spinning, still feels a slightly off balance when he walks.  No falls.  Past Medical History:  Diagnosis Date  . Diabetes-- A1c 6.7 (2016) 05/15/2015  . 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 Lipoma of arm 2016    Past Surgical History:  Procedure Laterality Date  . INGUINAL HERNIA REPAIR  2000   unilateral  . MASS EXCISION Left 08/04/2015   Procedure: EXCISION OF MASS ON LEFT ARM;  Surgeon: [name], MD;  Location: [facility];  Service: General;  Laterality: Left;    Social History   Socioeconomic History  . Marital status: Married   Spouse name: Not on file  . Number of children: 4  . Years of education: Not on file  . Highest education level: Not on file  Occupational History  . Occupation: city of [place], [place]  . Financial resource strain: Not on file  . Food insecurity:    Worry: Not on file    Inability: Not on file  . Transportation needs:    Medical: Not on file    Non-medical: Not on file  Tobacco Use  . Smoking status: Never Smoker  . Smokeless tobacco: Never Used  Substance and Sexual Activity  . Alcohol use: Not Currently    Alcohol/week: 0.0 standard drinks    Comment: rarely  . Drug use: No  . Sexual activity: Not on file  Lifestyle  . Physical activity:    Days per week: Not on file    Minutes per session: Not on file  . Stress: Not on file  Relationships  . Social connections:    Talks on phone: Not on file    Gets together: Not on file    Attends religious service: Not on file    Active member of club or organization: Not on file    Attends meetings of clubs or organizations: Not on file    Relationship status: Not on file  . Intimate partner violence:    Fear of current or ex partner: Not on file    Emotionally abused: Not on file  Physically abused: Not on file    Forced sexual activity: Not on file  Other Topics Concern  . Not on file  Social History Narrative   Occupation: works for the [employer] (not sedentary)    lost wife 06-2014 , breast cancer   2 children  2001, 2003--- live w/ him         Allergies as of 12/17/2018   No Known Allergies     Medication List       Accurate as of December 17, 2018 11:59 PM. Always use your most recent med list.        meclizine 12.5 MG tablet Commonly known as:  ANTIVERT Take 1 tablet (12.5 mg total) by mouth 3 (three) times daily as needed for dizziness.           Objective:   Physical Exam BP 126/78 (BP Location: Left Arm, Patient Position: Sitting, Cuff Size: Normal)   Pulse (!) 55   Temp 97.9 F (36.6  C) (Oral)   Resp 16   Ht 6\' 1"  (1.854 m)   Wt 283 lb 2 oz (128.4 kg)   SpO2 98%   BMI 37.35 kg/m  General:   Well developed, NAD, BMI noted. HEENT:  Normocephalic . Face symmetric, atraumatic. TMs normal. Neck: + Carotid pulses bilaterally Lungs:  CTA B Normal respiratory effort, no intercostal retractions, no accessory muscle use. Heart: RRR,  no murmur.  No pretibial edema bilaterally  Skin: Not pale. Not jaundice Neurologic:  alert & oriented X3.  Speech normal, gait appropriate for age and unassisted EOMI, no nystagmus, he did feel slightly off balance when I tested his eyes. Reflexes: Symmetrically decreased throughout. Motor symmetric. Psych--  Cognition and judgment appear intact.  Cooperative with normal attention span and concentration.  Behavior appropriate. No anxious or depressed appearing.      Assessment     Assessment Diabetes, A1c 6.7 2006 Hyperlipidemia  Leiomyosarcoma, L arm  S/p excision 07-2015 , saw Dr Valere Dross, rec wider excision, pt d/w surgery and declined     PLAN: Vertigo:  S/o hospital admission. A MRI of the brain was negative, no evidence of an acute neurological event at this point. He feels better, we talk about possibly doing vestibular rehab and will proceed if the improvement does not continue. Recommend to keep himself well-hydrated, take Antivert prn as RX by the hospital, avoid sudden head movements. He was referred to the neurologist by the hospital team, and that referral is pending. If he continues to improve , RTC 06-2019 as a schedule for CPX

## 2018-12-17 NOTE — Patient Instructions (Signed)
Rest  Drink plenty fluids  If you are not gradually improving call me for a vestibular rehabilitation  Proceed with the neurology visit  Call anytime if you have severe symptoms  Otherwise see you in September for your physical exam

## 2018-12-17 NOTE — Telephone Encounter (Signed)
Seen today. 

## 2018-12-18 NOTE — Assessment & Plan Note (Signed)
Vertigo:  S/o hospital admission. A MRI of the brain was negative, no evidence of an acute neurological event at this point. He feels better, we talk about possibly doing vestibular rehab and will proceed if the improvement does not continue. Recommend to keep himself well-hydrated, take Antivert prn as RX by the hospital, avoid sudden head movements. He was referred to the neurologist by the hospital team, and that referral is pending. If he continues to improve , RTC 06-2019 as a schedule for CPX

## 2018-12-24 ENCOUNTER — Ambulatory Visit: Payer: 59 | Admitting: Physical Therapy

## 2018-12-24 ENCOUNTER — Ambulatory Visit: Payer: 59 | Attending: Internal Medicine | Admitting: Rehabilitative and Restorative Service Providers"

## 2018-12-24 ENCOUNTER — Other Ambulatory Visit: Payer: Self-pay

## 2018-12-24 ENCOUNTER — Encounter: Payer: Self-pay | Admitting: Rehabilitative and Restorative Service Providers"

## 2018-12-24 DIAGNOSIS — R2681 Unsteadiness on feet: Secondary | ICD-10-CM | POA: Diagnosis not present

## 2018-12-24 DIAGNOSIS — H8112 Benign paroxysmal vertigo, left ear: Secondary | ICD-10-CM | POA: Insufficient documentation

## 2018-12-24 NOTE — Patient Instructions (Addendum)
Habituation - Tip Card  1.The goal of habituation training is to assist in decreasing symptoms of vertigo, dizziness, or nausea provoked by specific head and body motions. 2.These exercises may initially increase symptoms; however, be persistent and work through symptoms. With repetition and time, the exercises will assist in reducing or eliminating symptoms. 3.Exercises should be stopped and discussed with the therapist if you experience any of the following: - Sudden change or fluctuation in hearing - New onset of ringing in the ears, or increase in current intensity - Any fluid discharge from the ear - Severe pain in neck or back - Extreme nausea  Copyright  VHI. All rights reserved.   Habituation - Rolling   With pillow under head, start on back. Roll to your right side.  Hold until dizziness stops, plus 20 seconds and then roll to the left side.  Hold until dizziness stops, plus 20 seconds.  Repeat sequence 5 times per session. Do 2 sessions per day.  Copyright  VHI. All rights reserved.   Gaze Stabilization - Tip Card  1.Target must remain in focus, not blurry, and appear stationary while head is in motion. 2.Perform exercises with small head movements (45 to either side of midline). 3.Increase speed of head motion so long as target is in focus. 4.If you wear eyeglasses, be sure you can see target through lens (therapist will give specific instructions for bifocal / progressive lenses). 5.These exercises may provoke dizziness or nausea. Work through these symptoms. If too dizzy, slow head movement slightly. Rest between each exercise. 6.Exercises demand concentration; avoid distractions. 7.For safety, perform standing exercises close to a counter, wall, corner, or next to someone.  Copyright  VHI. All rights reserved.   Gaze Stabilization - Standing Feet Apart   Feet shoulder width apart, keeping eyes on target on wall 3 feet away, tilt head down slightly and move head side  to side for 30 seconds. Repeat while moving head up and down for 30 seconds. *Work up to tolerating 60 seconds, as able. Do 2-3 sessions per day.   Copyright  VHI. All rights reserved.            

## 2018-12-24 NOTE — Therapy (Signed)
[facility] [address] [place], [place], [address] Phone: [phone]   Fax:  [phone]  Physical Therapy Evaluation  Patient Details  Name: Gary [name] MRN: [id number] Date of Birth: Sep 10, 1956 Referring Provider (PT): Kathlene November, MD   Encounter Date: 12/24/2018  PT End of Session - 12/24/18 1342    Visit Number  1    Number of Visits  4    Date for PT Re-Evaluation  01/23/19    Authorization Type  [employer] $25 copay    PT Start Time  1315    PT Stop Time  1400    PT Time Calculation (min)  45 min    Activity Tolerance  Patient tolerated treatment well    Behavior During Therapy  [facility] for tasks assessed/performed       Past Medical History:  Diagnosis Date  . Diabetes-- A1c 6.7 (2016) 05/15/2015  . 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 Lipoma of arm 2016    Past Surgical History:  Procedure Laterality Date  . INGUINAL HERNIA REPAIR  2000   unilateral  . MASS EXCISION Left 08/04/2015   Procedure: EXCISION OF MASS ON LEFT ARM;  Surgeon: [name], MD;  Location: [facility];  Service: General;  Laterality: Left;    There were no vitals filed for this visit.   Subjective Assessment - 12/24/18 1318    Subjective  The patient noted sudden onset of room spinning vertigo that was constant in nature beginning last Thursday 12/13/18.  Imaging on acute care was negative.  He notes his balance is off, but no dizziness.  He continued with some queasiness after leaving the hospital.  He notes bright lights at night bother him.    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     Pertinent History  diabetes, sleep apnea    Patient Stated Goals  Evaluate / unsure if he has any current needs.     Currently in Pain?  No/denies         [facility] PT Assessment - 12/24/18 1322      Assessment   Medical Diagnosis  peripheral vertigo    Referring Provider (PT)  Kathlene November, MD    Onset Date/Surgical Date   12/13/18    Hand Dominance  Right    Prior Therapy  acute PT      Precautions   Precautions  None      Restrictions   Weight Bearing Restrictions  No      Balance Screen   Has the patient fallen in the past 6 months  No    Has the patient had a decrease in activity level because of a fear of falling?   No    Is the patient reluctant to leave their home because of a fear of falling?   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        Symptom Behavior   Subjective history of current problem  sudden onset of room spinning, n/v  Type of Dizziness   Spinning    Frequency of Dizziness  none at this time    Duration of Dizziness  hours    Symptom Nature  Constant    Aggravating Factors  No known aggravating factors    Relieving Factors  No known relieving factors    Progression of Symptoms  Better    History of similar episodes  one episode of sweating and shortness of  breath 3 days prior to vertigo onset      Oculomotor Exam   Oculomotor Alignment  Normal    Ocular ROM  WFLs    Spontaneous  Absent    Gaze-induced   Absent    Smooth Pursuits  Intact    Saccades  Intact      Vestibulo-Ocular Reflex   VOR 1 Head Only (x 1 viewing)  x 1 viewing at self regulated pace x 10 reps without dizziness    Comment  Head impulse test=+ R head impulse test for refixation saccade; no dizziness reported      Positional Testing   Dix-Hallpike  Dix-Hallpike Right;Dix-Hallpike Left    Horizontal Canal Testing  Horizontal Canal Right;Horizontal Canal Left      Dix-Hallpike Right   Dix-Hallpike Right Duration  none     Dix-Hallpike Right Symptoms  No nystagmus      Dix-Hallpike Left   Dix-Hallpike Left Duration  none    Dix-Hallpike Left Symptoms  No nystagmus      Horizontal Canal Right   Horizontal Canal Right Duration  ongoing, low amplitude that can only be viewed using alexander's law/ mild queasiness with R rolling    Horizontal Canal Right Symptoms  Ageotrophic      Horizontal Canal Left   Horizontal Canal Left Duration  ongoing, low amplitude only viewed with alexander's law    Horizontal Canal Left Symptoms  Ageotrophic          Objective measurements completed on examination: See above findings.       Vestibular Treatment/Exercise - 12/24/18 1338      Vestibular Treatment/Exercise   Vestibular Treatment Provided  Canalith Repositioning;Habituation    Canalith Repositioning  Canal Roll Left    Habituation Exercises  Horizontal Roll      Canal Roll Left   Number of Reps   1    Overall Response   Improved Symptoms   no nystagmus with retesting   Response Details   Performed [place], 2011 manuever for horiozntal cupulolithiasis using vibration at first and fourth positions.      Horizontal Roll   Number of Reps   3    Symptom Description   no nausea on 2nd and 3rd reps.            PT Education - 12/24/18 1343    Education Details  HEP for habituation and gaze x 1 viewing    Person(s) Educated  Patient    Methods  Explanation    Comprehension  Verbalized understanding          PT Long Term Goals - 12/24/18 1348      PT LONG TERM GOAL #1   Title  The patient will be indep with habituation HEP.    Time  4    Period  Weeks    Target Date  01/23/19      PT LONG TERM GOAL #2   Title  The patient will have negative positional testing indicating resolution of BPPV.    Time  4  Period  Weeks    Target Date  01/23/19      PT LONG TERM GOAL #3   Title  The patient will have further balance assessment and HEP recommended.    Time  4    Period  Weeks    Target  Date  01/23/19             Plan - 12/24/18 1343    Clinical Impression Statement  The patient is a 63 yo male presenting to outpatient physical therapy with positional testing consistent with L horizontal cupulolithiasis due to greater R symptoms of apogeotropic nystagmus.  His subjective reports of dizziness are mild at this time as compared with presentation to hospital on 12/13/18.  PT performed treatment for horizontal cupulolithiasis, recommended HEP for gaze adaptation.      Personal Factors and Comorbidities  Comorbidity 1    Comorbidities  diabetes    Examination-Activity Limitations  Locomotion Level    Examination-Participation Restrictions  Other   work   Stability/Clinical Decision Making  Stable/Uncomplicated    Clinical Decision Making  Low    Rehab Potential  Good    PT Frequency  1x / week    PT Duration  4 weeks    PT Treatment/Interventions  ADLs/Self Care Home Management;Therapeutic activities;Therapeutic exercise;Balance training;Neuromuscular re-education;Gait training;Functional mobility training;Stair training;Patient/family education;Canalith Repostioning;Vestibular    PT Next Visit Plan  check BPPV, check habituation HEP    Consulted and Agree with Plan of Care  Patient       Patient will benefit from skilled therapeutic intervention in order to improve the following deficits and impairments:  Dizziness, Decreased balance  Visit Diagnosis: BPPV (benign paroxysmal positional vertigo), left  Unsteadiness on feet     Problem List Patient Active Problem List   Diagnosis Date Noted  . Vertigo 12/13/2018  . Peripheral vertigo   . Sinus bradycardia   . OSA on CPAP   . Obesity (BMI 30-39.9)   . Snoring 08/29/2018  . Erectile dysfunction 08/29/2018  . PCP NOTES >>>>>>>>>>>>>>>>>>>> 02/11/2016  . Elevated BP 02/11/2016  . Malignant tumor of soft tissue of upper limb ([facility]) 08/18/2015  . 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 Diabetes-- A1c 6.7 (2016)  05/15/2015  . Annual physical exam 06/10/2011  . Rash 06/10/2011  . LEUKOPENIA, MILD 05/26/2010    Alwaleed Obeso, PT 12/24/2018, 11:09 PM  [facility] [address] [place], [place], [address] Phone: [phone]   Fax:  [phone]  Name: Gary [name] MRN: [id number] Date of Birth: 1956-06-10

## 2018-12-26 ENCOUNTER — Encounter: Payer: Self-pay | Admitting: Neurology

## 2018-12-26 DIAGNOSIS — G4733 Obstructive sleep apnea (adult) (pediatric): Secondary | ICD-10-CM | POA: Diagnosis not present

## 2019-01-01 ENCOUNTER — Telehealth: Payer: Self-pay | Admitting: Rehabilitative and Restorative Service Providers"

## 2019-01-01 NOTE — Telephone Encounter (Signed)
Gary [name]  was contacted today regarding the temporary closing of OP Rehab Services due to Covid-19.  Therapist discussed:  Vertigo has cleared  OP Rehabilitation Services will follow up with patients when we are able to resume care.  [facility], [place] [address] [place], [place]  [address] Phone:  [phone] Fax:  [phone]

## 2019-01-10 ENCOUNTER — Ambulatory Visit: Payer: 59 | Admitting: Rehabilitative and Restorative Service Providers"

## 2019-01-15 ENCOUNTER — Telehealth: Payer: Self-pay | Admitting: Neurology

## 2019-01-15 NOTE — Telephone Encounter (Signed)
Called and LVM for patient to call us back regarding 5/5 appointment, advised that we want to offer a virtual visit in the next week or so. Requested patient call us back, I included my work number and hours.

## 2019-01-16 ENCOUNTER — Telehealth: Payer: Self-pay | Admitting: Neurology

## 2019-01-16 NOTE — Telephone Encounter (Signed)
I called pt to update his chart and discuss his appt. No answer, left a message asking him to call me back.

## 2019-01-16 NOTE — Telephone Encounter (Signed)
Due to current COVID 19 pandemic, our office is severely reducing in office visits for at least the next 2 weeks, in order to minimize the risk to our patients and healthcare providers.   Called patient and was able to schedule him for a virtual visit on 04/14. I explained to him the process of setting up the webex meeting from his phone and stayed on the phone while he downloaded the app. I informed him that he will be receiving an e-mail with further instruction, as well as a phone call from Dr. Guadelupe Sabin nurse to update chart history. Patient verbalized understanding.  Pt understands that although there may be some limitations with this type of visit, we will take all precautions to reduce any security or privacy concerns.  Pt understands that this will be treated like an in office visit and we will file with pt's insurance, and there may be a patient responsible charge related to this service.  Pt's email is [email or web address]. Pt understands that the [employer] webex software must be downloaded and operational on the device pt plans to use for the visit.

## 2019-01-16 NOTE — Telephone Encounter (Signed)
error 

## 2019-01-17 NOTE — Telephone Encounter (Signed)
I called pt to discuss his appt and update his chart. No answer, left a messge asking him to call me back.

## 2019-01-20 ENCOUNTER — Encounter: Payer: Self-pay | Admitting: Neurology

## 2019-01-21 NOTE — Telephone Encounter (Signed)
I called pt. Pt's meds, allergies, and PMH were updated.  Pt reports that he works strange hours. He thinks that he feels a little better on auto pap therapy.  I helped pt get the [employer] webex meetings app on his phone and explained that he needs to click the "join meeting" button in his email at his appt time tomorrow. Pt verbalized understanding.

## 2019-01-22 ENCOUNTER — Other Ambulatory Visit: Payer: Self-pay

## 2019-01-22 ENCOUNTER — Telehealth: Payer: Self-pay | Admitting: Neurology

## 2019-01-22 ENCOUNTER — Ambulatory Visit (INDEPENDENT_AMBULATORY_CARE_PROVIDER_SITE_OTHER): Payer: 59 | Admitting: Neurology

## 2019-01-22 ENCOUNTER — Encounter: Payer: Self-pay | Admitting: Neurology

## 2019-01-22 DIAGNOSIS — G4733 Obstructive sleep apnea (adult) (pediatric): Secondary | ICD-10-CM | POA: Diagnosis not present

## 2019-01-22 DIAGNOSIS — Z9989 Dependence on other enabling machines and devices: Secondary | ICD-10-CM

## 2019-01-22 NOTE — Progress Notes (Signed)
Interim history:  Gary [name] is a 63 year old right-handed gentleman with an underlying medical history of diabetes, left arm tumor with status post excision, and obesity, with whom I am conducting a virtual, video based FU visit via Webex in lieu of a face-to-face visit for follow-up consultation of his obstructive sleep apnea, after home sleep testing and starting AutoPap therapy. The patient is unaccompanied today and joins from home.  I first met him on 11/01/2018 at the request of his primary care physician, Dr. Kathlene November, at which time he reported snoring and witnessed apneas. He was advised to proceed with a sleep study. His insurance denied her laboratory attendance sleep study. He had a home sleep test on 11/15/2018 which indicated severe sleep apnea with an AHI of 35.5 per hour, average oxygen saturation of 94%, nadir of 81% with time below or at 88% saturation of 7.4 minutes. He was advised to proceed with AutoPap therapy at home.   Today, 01/17/2019: Please also see below for virtual visit documentation.   I reviewed his AutoPap compliance data from 12/21/2021 to 01/20/2019 which is a total of 30 days, during which time he used his machine 27 days with percent used days greater than 4 hours at 57%, indicating suboptimal compliance with an average usage for days on treatment of 4 hours and 24 minutes, residual AHI at goal at 0.7 per hour, and 5th percentile pressure right at 9 cm, leak acceptable with the 95th percentile at 9.6 L/m on a pressure range of 7 cm to 15 cm with EPR. His best compliance was in the month of mid February through mid-March from 11/27/2018 through 12/26/2018 and his percent used days greater than 4 hours was 77% at the time, adequate. His set update was 11/27/2018.   The patient's allergies, current medications, family history, past medical history, past social history, past surgical history and problem list were reviewed and updated as appropriate.    Previously (copied  from previous notes for reference):   11/01/2018: (He) reports snoring and excessive daytime somnolence as well as witnessed apneas. I reviewed your office note from 08/29/2016. His Epworth sleepiness score is 12 out of 24 today, fatigue score is 29 out of 63. He reports that his snoring can be loud. He works for the city of [place]. He lives with his children, ages 25 and 31. He is separated. He is a nonsmoker and drinks alcohol infrequently, drinks caffeine very occasionally. He reports that he does not get enough sleep and that he works a lot. He works full-time for [employer] in [employer]. 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 On Wednesdays and weekends he works part time in [place].  He is not aware of any family history of OSA. He is trying to lose weight. He has lost over 10 lb already.  His bedtime and rise time vary because of his work schedule. He tries to be in bed around 10:30 but when he works late he may not be in bed until 1 AM. He has to wake up around 5 almost every day. He denies morning headaches but does have nocturia on average twice. He does not watch TV in his bedroom. He has no pets in the household.  His Past Medical History Is Significant For: Past Medical History:  Diagnosis Date  . Diabetes-- A1c 6.7 (2016) 05/15/2015  . Leiomyosarcoma ([place]) 05/20/2015  . Lipoma of arm 2016    His Past Surgical History  Is Significant For: Past Surgical History:  Procedure Laterality Date  . INGUINAL HERNIA REPAIR  2000   unilateral  . MASS EXCISION Left 08/04/2015   Procedure: EXCISION OF MASS ON LEFT ARM;  Surgeon: [name], MD;  Location: [facility];  Service: General;  Laterality: Left;    His Family History Is Significant For: Family History  Problem Relation Age of Onset  . Hypertension Mother   . Diabetes Mother   . Colon cancer Father        <60  . Heart attack Neg Hx   . Prostate cancer Neg Hx      His Social History Is Significant For: Social History   Socioeconomic History  . Marital status: Married    Spouse name: Not on file  . Number of children: 4  . Years of education: Not on file  . Highest education level: Not on file  Occupational History  . Occupation: city of [place], [place]  . Financial resource strain: Not on file  . Food insecurity:    Worry: Not on file    Inability: Not on file  . Transportation needs:    Medical: Not on file    Non-medical: Not on file  Tobacco Use  . Smoking status: Never Smoker  . Smokeless tobacco: Never Used  Substance and Sexual Activity  . Alcohol use: Not Currently    Alcohol/week: 0.0 standard drinks    Comment: rarely  . Drug use: No  . Sexual activity: Not on file  Lifestyle  . Physical activity:    Days per week: Not on file    Minutes per session: Not on file  . Stress: Not on file  Relationships  . Social connections:    Talks on phone: Not on file    Gets together: Not on file    Attends religious service: Not on file    Active member of club or organization: Not on file    Attends meetings of clubs or organizations: Not on file    Relationship status: Not on file  Other Topics Concern  . Not on file  Social History Narrative   Occupation: works for the [employer] (not sedentary)    lost wife 06-2014 , breast cancer   2 children  2001, 2003--- live w/ him       His Allergies Are:  No Known Allergies:   His Current Medications Are:  Outpatient Encounter Medications as of 01/22/2019  Medication Sig  . meclizine (ANTIVERT) 12.5 MG tablet Take 1 tablet (12.5 mg total) by mouth 3 (three) times daily as needed for dizziness.   No facility-administered encounter medications on file as of 01/22/2019.   :  Review of Systems:  Out of a complete 14 point review of systems, all are reviewed and negative with the exception of these symptoms as listed below:  Virtual Visit via Video Note on  01/22/2019:  I connected with Gary [name] on 01/22/19 at  9:30 AM EDT by a video enabled telemedicine application and verified that I am speaking with the correct person using two identifiers.   I discussed the limitations of evaluation and management by telemedicine and the availability of in person appointments. The patient expressed understanding and agreed to proceed.  History of Present Illness: He reports doing well with his AutoPap machine. He has a erratic work schedule and has to be on call. He works for the city. He is still working outside of  the home. He feels that the AutoPap has helped him sleep better. He feels better rested and has more energy during the day. He has not been a long sleeper for 20+ years. He does not typically sleep 7 hours. He is motivated to continue with treatment. He is using a nasal mask and DME company is [employer]. He has not received a filter yet.   Observations/Objective:  His most recent vital signs on file are from : Blood pressure 148/100, pulse 98, temperature 98.4, weight 226.8 pounds for BMI of 32.54.     On examination, he is pleasant, conversant, in no acute distress. Face is symmetric. Speech is clear without dysarthria, hypophonia or voice tremor noted. He wears corrective eyeglasses. He is able to move without problems, walks without difficulty.  Assessment and Plan: In summary, Gary [name] is a very pleasant 63 year old male with an underlying medical history of diabetes, left arm tumor with status post excision, and obesity, who presents for a virtual, video based follow-up consultation of his obstructive sleep apnea, which was determined to be in the severe range by home sleep testing on 11/15/2018. He has established treatment at home with AutoPap, set up date was 11/27/2018. He is compliant with treatment, lately a little bit more erratic with his usage and slightly suboptimal but has been 77% compliant for more than 4 hours especially in the  first 30 days. He is highly motivated to continue with treatment and endorses improvements in sleep quality, feeling better rested and having more energy during the day. He is commended for his treatment adherence. We talked about his home sleep test results and reviewed his compliance data together. He is encouraged to work on weight loss. He is advised to follow-up routinely in 6 months with one of our nurse practitioners. I will place an order for supplies. I answered all his questions today and the patient was in agreement.   Follow Up Instructions: 1. Continue using CPAP regularly with full compliance, patient is commended for treatment adherence. 2. Follow-up with NP next time, 6 mo.  3. CPAP supply order placed, will fax to [facility]. 4. Call or email through My Chart for any interim questions or concerns.   I discussed the assessment and treatment plan with the patient. The patient was provided an opportunity to ask questions and all were answered. The patient agreed with the plan and demonstrated an understanding of the instructions.   The patient was advised to call back or seek an in-person evaluation if the symptoms worsen or if the condition fails to improve as anticipated.  I provided 25 minutes of non-face-to-face time during this encounter.   Star Age, MD

## 2019-01-22 NOTE — Telephone Encounter (Signed)
LVM to schedule a 6 month follow-up with Jinny Blossom NP per Dr. Rexene Alberts.

## 2019-01-22 NOTE — Patient Instructions (Signed)
Given verbally, during today's virtual video-based encounter, with verbal feedback received.   

## 2019-01-26 DIAGNOSIS — G4733 Obstructive sleep apnea (adult) (pediatric): Secondary | ICD-10-CM | POA: Diagnosis not present

## 2019-01-29 ENCOUNTER — Ambulatory Visit: Payer: Self-pay | Admitting: Neurology

## 2019-02-12 ENCOUNTER — Ambulatory Visit: Payer: Self-pay | Admitting: Neurology

## 2019-02-25 DIAGNOSIS — G4733 Obstructive sleep apnea (adult) (pediatric): Secondary | ICD-10-CM | POA: Diagnosis not present

## 2019-03-12 ENCOUNTER — Encounter: Payer: Self-pay | Admitting: Rehabilitative and Restorative Service Providers"

## 2019-03-12 ENCOUNTER — Other Ambulatory Visit: Payer: Self-pay

## 2019-03-12 ENCOUNTER — Ambulatory Visit: Payer: 59 | Attending: Internal Medicine | Admitting: Rehabilitative and Restorative Service Providers"

## 2019-03-12 DIAGNOSIS — H8112 Benign paroxysmal vertigo, left ear: Secondary | ICD-10-CM

## 2019-03-12 DIAGNOSIS — R2681 Unsteadiness on feet: Secondary | ICD-10-CM | POA: Diagnosis present

## 2019-03-12 NOTE — Therapy (Addendum)
[facility] [address] [place], [place], [address] Phone: [phone]   Fax:  [phone]  Physical Therapy Treatment and Discharge Summary  Patient Details  Name: Gary [name] MRN: [id number] Date of Birth: Sep 02, 1956 Referring Provider (PT): Kathlene November, MD   Encounter Date: 03/12/2019  PT End of Session - 03/12/19 0803    Visit Number  2    Number of Visits  4    Date for PT Re-Evaluation  01/23/19    Authorization Type  [employer] $25 copay    PT Start Time  0804    PT Stop Time  0835    PT Time Calculation (min)  31 min    Activity Tolerance  Patient tolerated treatment well    Behavior During Therapy  [facility] for tasks assessed/performed       Past Medical History:  Diagnosis Date  . Diabetes-- A1c 6.7 (2016) 05/15/2015  . 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 Lipoma of arm 2016    Past Surgical History:  Procedure Laterality Date  . INGUINAL HERNIA REPAIR  2000   unilateral  . MASS EXCISION Left 08/04/2015   Procedure: EXCISION OF MASS ON LEFT ARM;  Surgeon: [name], MD;  Location: [facility];  Service: General;  Laterality: Left;    There were no vitals filed for this visit.  Subjective Assessment - 03/12/19 0802    Subjective  The patient reports he has intermittent spells of dizziness.  He goes weeks at a time without dizziness.  He feels onset is sporadic without hte same provoking movement.  His last episode of dizziness was last week on Thursday described as "off", nothing spinning, imbalance, whoozy sensation.   Sensation is worse in the morning when he first gets out of bed.  The only thing that bothers him now is driving at night (the headlights move).     Pertinent History  diabetes, sleep apnea    Patient Stated Goals  Evaluate / unsure if he has any current needs.     Currently in Pain?  No/denies       CLINIC OPERATION CHANGES: Outpatient [facility] Rehab is open at lower capacity  following universal masking, social distancing, and patient screening.  The patient's COVID risk of complications score is 4.    [facility] PT Assessment - 03/12/19 0805      Standardized Balance Assessment   Standardized Balance Assessment  Berg Balance Test      Berg Balance Test   Sit to Stand  Able to stand without using hands and stabilize independently    Standing Unsupported  Able to stand safely 2 minutes    Sitting with Back Unsupported but Feet Supported on Floor or Stool  Able to sit safely and securely 2 minutes    Stand to Sit  Sits safely with minimal use of hands    Transfers  Able to transfer safely, minor use of hands    Standing Unsupported with Eyes Closed  Able to stand 10 seconds safely    Standing Unsupported with Feet Together  Able to place feet together independently and stand 1 minute safely    From Standing, Reach Forward with Outstretched Arm  Can reach confidently >25 cm (10")    From Standing Position, Pick up Object from Floor  Able to pick up shoe safely and easily    From Standing Position, Turn to Look Behind Over each Shoulder  Looks behind from both sides and weight shifts well  Turn 360 Degrees  Able to turn 360 degrees safely in 4 seconds or less    Standing Unsupported, Alternately Place Feet on Step/Stool  Able to stand independently and safely and complete 8 steps in 20 seconds    Standing Unsupported, One Foot in Front  Able to place foot tandem independently and hold 30 seconds    Standing on One Leg  Able to lift leg independently and hold > 10 seconds    Total Score  56    Berg comment:  56/56      Functional Gait  Assessment   Gait assessed   Yes    Gait Level Surface  Walks 20 ft in less than 5.5 sec, no assistive devices, good speed, no evidence for imbalance, normal gait pattern, deviates no more than 6 in outside of the 12 in walkway width.    Change in Gait Speed  Able to smoothly change walking speed without loss of balance or gait  deviation. Deviate no more than 6 in outside of the 12 in walkway width.    Gait with Horizontal Head Turns  Performs head turns smoothly with no change in gait. Deviates no more than 6 in outside 12 in walkway width    Gait with Vertical Head Turns  Performs head turns with no change in gait. Deviates no more than 6 in outside 12 in walkway width.    Gait and Pivot Turn  Pivot turns safely within 3 sec and stops quickly with no loss of balance.    Step Over Obstacle  Is able to step over 2 stacked shoe boxes taped together (9 in total height) without changing gait speed. No evidence of imbalance.    Gait with Narrow Base of Support  Is able to ambulate for 10 steps heel to toe with no staggering.    Gait with Eyes Closed  Walks 20 ft, no assistive devices, good speed, no evidence of imbalance, normal gait pattern, deviates no more than 6 in outside 12 in walkway width. Ambulates 20 ft in less than 7 sec.    Ambulating Backwards  Walks 20 ft, no assistive devices, good speed, no evidence for imbalance, normal gait    Steps  Alternating feet, must use rail.    Total Score  29    FGA comment:  29/30 (he notes uses rail due to knees)         Vestibular Assessment - 03/12/19 0805      Vestibular Assessment   General Observation  No dizziness at rest.      Symptom Behavior   Type of Dizziness   Imbalance    Frequency of Dizziness  intermittent    Duration of Dizziness  hours    Symptom Nature  Intermittent      Positional Testing   Sidelying Test  Sidelying Right;Sidelying Left    Horizontal Canal Testing  Horizontal Canal Right;Horizontal Canal Left      Sidelying Right   Sidelying Right Duration  none    Sidelying Right Symptoms  No nystagmus      Sidelying Left   Sidelying Left Duration  none    Sidelying Left Symptoms  No nystagmus      Horizontal Canal Right   Horizontal Canal Right Duration  none    Horizontal Canal Right Symptoms  Normal      Horizontal Canal Left    Horizontal Canal Left Duration  none    Horizontal Canal Left Symptoms  Normal  Orthostatics   BP supine (x 5 minutes)  129/92    HR supine (x 5 minutes)  50    BP standing (after 1 minute)  119/82    HR standing (after 1 minute)  52    BP standing (after 3 minutes)  123/88    HR standing (after 3 minutes)  55               [facility] Adult PT Treatment/Exercise - 03/12/19 0838      Self-Care   Self-Care  Other Self-Care Comments    Other Self-Care Comments   Discussed nature of symptoms and spontaneous nature.  Patient no longer feeling spinning, but does note occasional "whooziness" and difficulty tolerating headlights at night.  PT recommended he schedule his annual eye exam (as this is due but he was unable due to pandemic), and monitor symptoms.  May benefit from f/u with MD if this continues.      Vestibular Treatment/Exercise - 03/12/19 0818      Vestibular Treatment/Exercise   Vestibular Treatment Provided  Gaze    Gaze Exercises  X1 Viewing Horizontal      X1 Viewing Horizontal   Foot Position  seated    Comments  Reviewed speed of movement and visual fixation component.            PT Education - 03/12/19 0837    Education Details  continue gaze adaptation exercises    Person(s) Educated  Patient    Methods  Explanation    Comprehension  Verbalized understanding          PT Long Term Goals - 03/12/19 0837      PT LONG TERM GOAL #1   Title  The patient will be indep with habituation HEP.    Baseline  Habituation symptoms resolved.  Encourage continuation of gaze adaptation.    Time  4    Period  Weeks    Status  Achieved      PT LONG TERM GOAL #2   Title  The patient will have negative positional testing indicating resolution of BPPV.    Time  4    Period  Weeks    Status  Achieved      PT LONG TERM GOAL #3   Title  The patient will have further balance assessment and HEP recommended.    Time  4    Period  Weeks    Status  Achieved             Plan - 03/12/19 0839    Clinical Impression Statement  The patient has met 3/3 LTGs.  He is no longer experiencing dizziness with rolling and notes sporadic symptoms that do not appear to be movement related.  PT recommends continued gaze adaptation exercise and f/u wit PT only if symptoms of vertigo return (will d/c in 1 month if no further follow-up).    Patient also due to eye exam.      PT Frequency  1x / week   1 visit; had to hold tx due to clinic closure in March   PT Treatment/Interventions  ADLs/Self Care Home Management;Therapeutic activities;Therapeutic exercise;Balance training;Neuromuscular re-education;Gait training;Functional mobility training;Stair training;Patient/family education;Canalith Repostioning;Vestibular    PT Next Visit Plan  patient to call if further symptoms occur.    Consulted and Agree with Plan of Care  Patient       Patient will benefit from skilled therapeutic intervention in order to improve the following deficits and impairments:  Dizziness,  Decreased balance  Visit Diagnosis: BPPV (benign paroxysmal positional vertigo), left  Unsteadiness on feet     Problem List PHYSICAL THERAPY DISCHARGE SUMMARY  Visits from Start of Care: 2  Current functional level related to goals / functional outcomes: *See note above/ patient did not return after last visit on 03/12/19   Remaining deficits: See above   Education / Equipment: Has home program.  Plan: Patient agrees to discharge.  Patient goals were partially met. Patient is being discharged due to not returning since the last visit.  ?????     Thank you for the referral of this patient. Rudell Cobb, MPT  [place], PT 03/12/2019, 8:41 AM  [facility] [address] [place], [place], [address] Phone: [phone]   Fax:  [phone]  Name: Gary [name] MRN: [id number] Date of Birth: October 09, 1956

## 2019-04-05 ENCOUNTER — Ambulatory Visit: Payer: Self-pay | Admitting: *Deleted

## 2019-04-05 ENCOUNTER — Encounter: Payer: Self-pay | Admitting: Family

## 2019-04-05 ENCOUNTER — Ambulatory Visit (INDEPENDENT_AMBULATORY_CARE_PROVIDER_SITE_OTHER): Payer: 59 | Admitting: Family

## 2019-04-05 DIAGNOSIS — R5383 Other fatigue: Secondary | ICD-10-CM

## 2019-04-05 DIAGNOSIS — S39012A Strain of muscle, fascia and tendon of lower back, initial encounter: Secondary | ICD-10-CM | POA: Diagnosis not present

## 2019-04-05 NOTE — Telephone Encounter (Signed)
Appt scheduled

## 2019-04-05 NOTE — Telephone Encounter (Signed)
Pt calling to request testing for COVID-19. Pt states that he has been fatigued since Tuesday night. Pt states that he has chills occasionally and symptoms feel like the flu. Pt states that he does feel some pressure in his head and was experiencing bad muscle spasms that have resolved at this time. Pt denies any fever, cough, SOB or recent travel. Pt transferred to [facility] in the office to be scheduled for an appt with PCP.   Reason for Disposition . [1] COVID-19 infection suspected by caller or triager AND [2] mild symptoms (cough, fever, or others) AND [6] no complications or SOB  Answer Assessment - Initial Assessment Questions 1. CLOSE CONTACT: "Who is the person with the confirmed or suspected COVID-19 infection that you were exposed to?"     No contact with anyone that tested positive 2. PLACE of CONTACT: "Where were you when you were exposed to COVID-19?" (e.g., home, school, medical waiting room; which city?)     n/a 3. TYPE of CONTACT: "How much contact was there?" (e.g., sitting next to, live in same house, work in same office, same building)     n/a 4. DURATION of CONTACT: "How long were you in contact with the COVID-19 patient?" (e.g., a few seconds, passed by person, a few minutes, live with the patient)    n/a 5. DATE of CONTACT: "When did you have contact with a COVID-19 patient?" (e.g., how many days ago)     n/a 6. TRAVEL: "Have you traveled out of the country recently?" If so, "When and where?"     * Also ask about out-of-state travel, since the CDC has identified some high-risk cities for community spread in the [place].     * Note: Travel becomes less relevant if there is widespread community transmission where the patient lives.     No 7. COMMUNITY SPREAD: "Are there lots of cases of COVID-19 (community spread) where you live?" (See public health department website, if unsure)       n/a 8. SYMPTOMS: "Do you have any symptoms?" (e.g., fever, cough, breathing difficulty)     Bad  muscle spasms and body feels tired and weak 9. PREGNANCY OR POSTPARTUM: "Is there any chance you are pregnant?" "When was your last menstrual period?" "Did you deliver in the last 2 weeks?"     n/a 10. HIGH RISK: "Do you have any heart or lung problems? Do you have a weak immune system?" (e.g., CHF, COPD, asthma, HIV positive, chemotherapy, renal failure, diabetes mellitus, sickle cell anemia)       No  Answer Assessment - Initial Assessment Questions 1. COVID-19 DIAGNOSIS: "Who made your Coronavirus (COVID-19) diagnosis?" "Was it confirmed by a positive lab test?" If not diagnosed by a HCP, ask "Are there lots of cases (community spread) where you live?" (See public health department website, if unsure)     No diagnosis 2. ONSET: "When did the COVID-19 symptoms start?"      tuesdsay 3. WORST SYMPTOM: "What is your worst symptom?" (e.g., cough, fever, shortness of breath, muscle aches)     fatigue 4. COUGH: "Do you have a cough?" If so, ask: "How bad is the cough?"       no 5. FEVER: "Do you have a fever?" If so, ask: "What is your temperature, how was it measured, and when did it start?"     no 6. RESPIRATORY STATUS: "Describe your breathing?" (e.g., shortness of breath, wheezing, unable to speak)      No difficulty breathing  7. BETTER-SAME-WORSE: "Are you getting better, staying the same or getting worse compared to yesterday?"  If getting worse, ask, "In what way?"     same 8. HIGH RISK DISEASE: "Do you have any chronic medical problems?" (e.g., asthma, heart or lung disease, weak immune system, etc.)     No 9. PREGNANCY: "Is there any chance you are pregnant?" "When was your last menstrual period?"     n/a 10. OTHER SYMPTOMS: "Do you have any other symptoms?"  (e.g., chills, fatigue, headache, loss of smell or taste, muscle pain, sore throat)       Fatigue, pressure in head, chills every now and then like having the flu, did have muscle spasms but they have gone away  Protocols used:  CORONAVIRUS (COVID-19) DIAGNOSED OR SUSPECTED-A-AH, CORONAVIRUS (COVID-19) EXPOSURE-A-AH

## 2019-04-05 NOTE — Progress Notes (Signed)
Virtual Visit via Video Note  I connected with Gary [name] on 04/05/19 at  3:00 PM EDT by a video enabled telemedicine application and verified that I am speaking with the correct person using two identifiers.  Location: Patient: home Provider: work   I discussed the limitations of evaluation and management by telemedicine and the availability of in person appointments. The patient expressed understanding and agreed to proceed.  History of Present Illness:  Patient is a 63 yr old male who presents today with chief complaint of fatigue. Fatigue began on Tuesday 04/02/19.  He reports that he has been sleeping more. Reports that on Wednesday night he had bad back spasms which eased up by Thursday.  Reports that he just feels "drained" like when I have the flu. Temp is 97.5 during today's visit.  Has some mild pressure in his head.  Reports that he has not really been eating since Tuesday. Drinking water. Denies SOB, cough. Denies sick contacts.  Denies dysuria/frequency or loss of taste/smell.  Patient works in [employer] in the city.  He reports that he has had 3 people out work over the last 14 days but due to [place] he is unsure if they had covid-19.     Past Medical History:  Diagnosis Date  . Diabetes-- A1c 6.7 (2016) 05/15/2015  . 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 Lipoma of arm 2016     Social History   Socioeconomic History  . Marital status: Married    Spouse name: Not on file  . Number of children: 4  . Years of education: Not on file  . Highest education level: Not on file  Occupational History  . Occupation: city of [place], [place]  . Financial resource strain: Not on file  . Food insecurity    Worry: Not on file    Inability: Not on file  . Transportation needs    Medical: Not on file    Non-medical: Not on file  Tobacco Use  . Smoking status: Never Smoker  . Smokeless tobacco: Never Used  Substance and Sexual Activity  . Alcohol use: Not  Currently    Alcohol/week: 0.0 standard drinks    Comment: rarely  . Drug use: No  . Sexual activity: Not on file  Lifestyle  . Physical activity    Days per week: Not on file    Minutes per session: Not on file  . Stress: Not on file  Relationships  . Social Herbalist on phone: Not on file    Gets together: Not on file    Attends religious service: Not on file    Active member of club or organization: Not on file    Attends meetings of clubs or organizations: Not on file    Relationship status: Not on file  . Intimate partner violence    Fear of current or ex partner: Not on file    Emotionally abused: Not on file    Physically abused: Not on file    Forced sexual activity: Not on file  Other Topics Concern  . Not on file  Social History Narrative   Occupation: works for the [employer] (not sedentary)    lost wife 06-2014 , breast cancer   2 children  2001, 2003--- live w/ him       Past Surgical History:  Procedure Laterality Date  . INGUINAL HERNIA REPAIR  2000   unilateral  . MASS EXCISION Left  08/04/2015   Procedure: EXCISION OF MASS ON LEFT ARM;  Surgeon: [name], MD;  Location: [facility];  Service: General;  Laterality: Left;    Family History  Problem Relation Age of Onset  . Hypertension Mother   . Diabetes Mother   . Colon cancer Father        <60  . Heart attack Neg Hx   . Prostate cancer Neg Hx     No Known Allergies  Current Outpatient Medications on File Prior to Visit  Medication Sig Dispense Refill  . meclizine (ANTIVERT) 12.5 MG tablet Take 1 tablet (12.5 mg total) by mouth 3 (three) times daily as needed for dizziness. (Patient not taking: Reported on 04/05/2019) 30 tablet 0   No current facility-administered medications on file prior to visit.     There were no vitals taken for this visit.   Observations/Objective:   Gen: Awake, alert, no acute distress- tired appearing Resp: Breathing is even and  non-labored Psych: calm/pleasant demeanor Neuro: Alert and Oriented x 3, + facial symmetry, speech is clear.   Assessment and Plan:  Fatigue- Will monitor over the weekend.  Pt advised to go to the ER If new/worsening symptoms.  If still fatigued on Monday will consider COVID-19 testing. I have written him out of work for Monday and Tuesday next week. We discussed need to self quarantine until further instruction. Also discussed supportive measures.   Back strain- improved. Monitor.  Follow Up Instructions:    I discussed the assessment and treatment plan with the patient. The patient was provided an opportunity to ask questions and all were answered. The patient agreed with the plan and demonstrated an understanding of the instructions.   The patient was advised to call back or seek an in-person evaluation if the symptoms worsen or if the condition fails to improve as anticipated.  Nance Pear, NP

## 2019-04-08 ENCOUNTER — Telehealth: Payer: Self-pay

## 2019-04-08 ENCOUNTER — Other Ambulatory Visit: Payer: Self-pay

## 2019-04-08 DIAGNOSIS — Z20822 Contact with and (suspected) exposure to covid-19: Secondary | ICD-10-CM

## 2019-04-08 NOTE — Telephone Encounter (Signed)
Copied from [place] [id number]. Topic: General - Inquiry >> Apr 08, 2019  7:32 AM Scherrie Gerlach wrote: Reason for CRM: pt calling this am and thought he was supposed to come for covid testing. Advised pt Melissa states she would consider if still feeling bad.  Pt had virtual visit Friday, 6/26. Pt states he IS still fatigued.  Unable to walk long distances. No other sx.

## 2019-04-08 NOTE — Telephone Encounter (Signed)
Spoke with patient, scheduled him for COVID 19 test today at 12:15 pm at [facility].  Testing protocol reviewed.

## 2019-04-08 NOTE — Telephone Encounter (Signed)
Please advise 

## 2019-04-08 NOTE — Telephone Encounter (Signed)
Please schedule patient for covid-19 testing due to new onset fatigue and probable covid exposure at work.    I spoke to pt. Advised him he should be contacted about scheduling. We also discussed that if COVID testing is negative and he continues to have fatigue he will need further evaluation and blood work in the lab. Pt verbalizes understanding

## 2019-04-09 ENCOUNTER — Ambulatory Visit (HOSPITAL_COMMUNITY)
Admission: EM | Admit: 2019-04-09 | Discharge: 2019-04-09 | Disposition: A | Discharge status: Home / Self Care | Payer: 59

## 2019-04-09 ENCOUNTER — Other Ambulatory Visit: Payer: Self-pay

## 2019-04-09 ENCOUNTER — Encounter (HOSPITAL_COMMUNITY): Payer: Self-pay

## 2019-04-09 ENCOUNTER — Telehealth: Payer: Self-pay

## 2019-04-09 DIAGNOSIS — R5383 Other fatigue: Secondary | ICD-10-CM

## 2019-04-09 DIAGNOSIS — B349 Viral infection, unspecified: Secondary | ICD-10-CM

## 2019-04-09 DIAGNOSIS — R52 Pain, unspecified: Secondary | ICD-10-CM

## 2019-04-09 NOTE — ED Triage Notes (Signed)
Pt states he been very fatigued and body aches x 1 week.

## 2019-04-09 NOTE — ED Provider Notes (Signed)
MRN: [id number] DOB: 1956-04-27  Subjective:   Gary [name] is a 63 y.o. male presenting for 1 week history of intermittent body aches and fatigue. Patient has had testing done for COVID but results are pending.  He is planning on setting up his care with [facility] and is simply waiting on results to have a full work-up done.  He has used ibuprofen and a tea made by his brother with unknown ingredients.  He does admit that the body aches have improved significantly but he still feels somewhat tired.  Patient works for the city of [place], needs a work note.  Denies personal history of cancer, sarcoidosis.  His father had a GI cancer secondary to longstanding history of smoking.  Denies smoking cigarettes.  Denies taking chronic medications.  No Known Allergies  Past Medical History:  Diagnosis Date  . Diabetes-- A1c 6.7 (2016) 05/15/2015  . 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 Lipoma of arm 2016     Past Surgical History:  Procedure Laterality Date  . INGUINAL HERNIA REPAIR  2000   unilateral  . MASS EXCISION Left 08/04/2015   Procedure: EXCISION OF MASS ON LEFT ARM;  Surgeon: [name], MD;  Location: [facility];  Service: General;  Laterality: Left;    ROS Denies fever, sinus pain, throat pain, ear pain, chest pain, shortness of breath, cough, nausea, vomiting, belly pain, urinary symptoms, rashes.  Objective:   Vitals: BP 111/74 (BP Location: Right Arm)   Pulse 83   Temp 99.7 F (37.6 C) (Oral)   Resp 18   Wt 290 lb (131.5 kg)   SpO2 100%   BMI 38.26 kg/m   Physical Exam Constitutional:      General: He is not in acute distress.    Appearance: Normal appearance. He is well-developed. He is not ill-appearing, toxic-appearing or diaphoretic.  HENT:     Head: Normocephalic and atraumatic.     Right Ear: External ear normal.     Left Ear: External ear normal.     Nose: Nose normal.     Mouth/Throat:     Mouth: Mucous membranes are moist.     Pharynx:  Oropharynx is clear.  Eyes:     General: No scleral icterus.    Extraocular Movements: Extraocular movements intact.     Pupils: Pupils are equal, round, and reactive to light.  Cardiovascular:     Rate and Rhythm: Normal rate and regular rhythm.     Heart sounds: Normal heart sounds. No murmur. No friction rub. No gallop.   Pulmonary:     Effort: Pulmonary effort is normal. No respiratory distress.     Breath sounds: Normal breath sounds. No stridor. No wheezing, rhonchi or rales.  Neurological:     Mental Status: He is alert and oriented to person, place, and time.  Psychiatric:        Mood and Affect: Mood normal.        Behavior: Behavior normal.        Thought Content: Thought content normal.    Assessment and Plan :   1. Viral syndrome   2. Fatigue, unspecified type   3. Generalized body aches    Counseled patient on nature of COVID-19 including modes of transmission, diagnostic testing, management and supportive care. Labs pending, suspect viral etiology of his symptoms.  Patient has very reassuring physical exam findings, vital signs.  Advised supportive care, offered symptomatic relief.  I offered patient a chest x-ray but he declined due  to reassuring physical exam findings and 100% pulse oximetry, no respiratory symptoms.  Counseled patient on potential for adverse effects with medications prescribed/recommended today, ER and return-to-clinic precautions discussed, patient verbalized understanding.     Jaynee Eagles, [place] 04/09/19 1737

## 2019-04-09 NOTE — Telephone Encounter (Signed)
Called patient. Reports that he has ongoing weakness. He states that he had covid testing yesterday. Weakness is worse. I advised him to go to the ED which he did not seem willing to do. He was willing to proceed to Urgent care. I advised him to go to the Urgent care at [facility] on [address] and I gave report to the [employer].

## 2019-04-09 NOTE — Telephone Encounter (Signed)
Copied from [place] [id number]. Topic: General - Other >> Apr 09, 2019  1:15 PM Keene Breath wrote: Reason for CRM: Patient called to inform the office that he had his COVID test, but he is feeling worse.  Patient wants to know if he should still quarantine or can he make an appt. To be seen.  Please advise and call patient back at [phone]

## 2019-04-09 NOTE — Telephone Encounter (Signed)
Pt seen by Melissa on 04/05/2019.

## 2019-04-09 NOTE — Discharge Instructions (Addendum)
We will manage this as a viral syndrome. You may take 500mg  Tylenol every 6 hours for pain and inflammation. For sore throat or cough try using a honey-based tea. Use 3 teaspoons of honey with juice squeezed from half lemon. Place shaved pieces of ginger into 1/2-1 cup of water and warm over stove top. Then mix the ingredients and repeat every 4 hours as needed.  Hydrate very well with at least 2 liters of water. Eat light meals such as soups to replenish electrolytes and soft fruits, veggies. Start an antihistamine like Zyrtec, Allegra or Claritin for any nasal congestion, sinus congestion or post-nasal drainage.

## 2019-04-11 ENCOUNTER — Telehealth: Payer: Self-pay | Admitting: Family

## 2019-04-11 LAB — NOVEL CORONAVIRUS, NAA: SARS-CoV-2, NAA: NOT DETECTED

## 2019-04-11 NOTE — Telephone Encounter (Signed)
Please contact pt and let him know that his coronavirus testing is negative.  How is he feeling?

## 2019-04-11 NOTE — Telephone Encounter (Signed)
Patient advised his results were negative, he reports he is feeling much better

## 2019-04-16 NOTE — Telephone Encounter (Signed)
Opened in error

## 2019-07-04 ENCOUNTER — Other Ambulatory Visit: Payer: Self-pay

## 2019-07-05 ENCOUNTER — Other Ambulatory Visit: Payer: Self-pay

## 2019-07-05 ENCOUNTER — Ambulatory Visit (INDEPENDENT_AMBULATORY_CARE_PROVIDER_SITE_OTHER): Payer: 59 | Admitting: Internal Medicine

## 2019-07-05 ENCOUNTER — Encounter: Payer: Self-pay | Admitting: Internal Medicine

## 2019-07-05 VITALS — BP 127/86 | HR 59 | Temp 97.3°F | Resp 16 | Ht 73.0 in | Wt 288.0 lb

## 2019-07-05 DIAGNOSIS — E119 Type 2 diabetes mellitus without complications: Secondary | ICD-10-CM | POA: Diagnosis not present

## 2019-07-05 DIAGNOSIS — R42 Dizziness and giddiness: Secondary | ICD-10-CM | POA: Diagnosis not present

## 2019-07-05 DIAGNOSIS — Z23 Encounter for immunization: Secondary | ICD-10-CM

## 2019-07-05 DIAGNOSIS — Z0001 Encounter for general adult medical examination with abnormal findings: Secondary | ICD-10-CM

## 2019-07-05 DIAGNOSIS — Z0000 Encounter for general adult medical examination without abnormal findings: Secondary | ICD-10-CM

## 2019-07-05 DIAGNOSIS — Z1211 Encounter for screening for malignant neoplasm of colon: Secondary | ICD-10-CM

## 2019-07-05 DIAGNOSIS — R2242 Localized swelling, mass and lump, left lower limb: Secondary | ICD-10-CM

## 2019-07-05 DIAGNOSIS — E785 Hyperlipidemia, unspecified: Secondary | ICD-10-CM | POA: Diagnosis not present

## 2019-07-05 LAB — CBC WITH DIFFERENTIAL/PLATELET
Basophils Absolute: 0 10*3/uL (ref 0.0–0.1)
Basophils Relative: 0.4 % (ref 0.0–3.0)
Eosinophils Absolute: 0 10*3/uL (ref 0.0–0.7)
Eosinophils Relative: 0.8 % (ref 0.0–5.0)
HCT: 47.1 % (ref 39.0–52.0)
Hemoglobin: 15.2 g/dL (ref 13.0–17.0)
Lymphocytes Relative: 31.9 % (ref 12.0–46.0)
Lymphs Abs: 1.4 10*3/uL (ref 0.7–4.0)
MCHC: 32.3 g/dL (ref 30.0–36.0)
MCV: 84 fl (ref 78.0–100.0)
Monocytes Absolute: 0.4 10*3/uL (ref 0.1–1.0)
Monocytes Relative: 8.7 % (ref 3.0–12.0)
Neutro Abs: 2.6 10*3/uL (ref 1.4–7.7)
Neutrophils Relative %: 58.2 % (ref 43.0–77.0)
Platelets: 254 10*3/uL (ref 150.0–400.0)
RBC: 5.6 Mil/uL (ref 4.22–5.81)
RDW: 15.6 % — ABNORMAL HIGH (ref 11.5–15.5)
WBC: 4.5 10*3/uL (ref 4.0–10.5)

## 2019-07-05 LAB — COMPREHENSIVE METABOLIC PANEL
ALT: 28 U/L (ref 0–53)
AST: 22 U/L (ref 0–37)
Albumin: 4.4 g/dL (ref 3.5–5.2)
Alkaline Phosphatase: 51 U/L (ref 39–117)
BUN: 17 mg/dL (ref 6–23)
CO2: 23 mEq/L (ref 19–32)
Calcium: 9.1 mg/dL (ref 8.4–10.5)
Chloride: 104 mEq/L (ref 96–112)
Creatinine, Ser: 1.27 mg/dL (ref 0.40–1.50)
GFR: 69.73 mL/min (ref >60.00)
Glucose, Bld: 94 mg/dL (ref 70–99)
Potassium: 4.2 mEq/L (ref 3.5–5.1)
Sodium: 137 mEq/L (ref 135–145)
Total Bilirubin: 1.2 mg/dL (ref 0.2–1.2)
Total Protein: 7.2 g/dL (ref 6.0–8.3)

## 2019-07-05 LAB — MICROALBUMIN / CREATININE URINE RATIO
Creatinine,U: 292.7 mg/dL
Microalb Creat Ratio: 2.9 mg/g (ref 0.0–30.0)
Microalb, Ur: 8.5 mg/dL — ABNORMAL HIGH (ref 0.0–1.9)

## 2019-07-05 LAB — LIPID PANEL
Cholesterol: 153 mg/dL (ref 0–200)
HDL: 49.8 mg/dL (ref >39.00)
LDL Cholesterol: 85 mg/dL (ref 0–99)
NonHDL: 103.31
Total CHOL/HDL Ratio: 3
Triglycerides: 92 mg/dL (ref 0.0–149.0)
VLDL: 18.4 mg/dL (ref 0.0–40.0)

## 2019-07-05 LAB — PSA: PSA: 0.65 ng/mL (ref 0.10–4.00)

## 2019-07-05 LAB — HEMOGLOBIN A1C: Hgb A1c MFr Bld: 6.4 % (ref 4.6–6.5)

## 2019-07-05 MED ORDER — MECLIZINE HCL 12.5 MG PO TABS: 12.5000 mg | ORAL_TABLET | Freq: Three times a day (TID) | ORAL | 6 refills | Status: DC | PRN

## 2019-07-05 NOTE — Assessment & Plan Note (Addendum)
-  Td 06-2019 - pnm 23: 2016 - shingrex: d/w pt, declined   - declined a flu shot. benefits d/w pt  -CCS: +FH Cscope 2006 (-) no polyps, second Cscope 07-2010 (-); failed previous referrals, accepts a referal today  -prostate ca screening: DRE within normal, check a PSA. -Diet trying to do well, active at work, exercises x 3/week   -Labs: CMP FLP CBC A1c micro PSA

## 2019-07-05 NOTE — Patient Instructions (Addendum)
Per our records you are due for an eye exam. Please contact your eye doctor to schedule an appointment. Please have them send copies of your office visit notes to [place]. Our fax number is [phone].   GO TO THE LAB : Get the blood work     GO TO THE FRONT DESK Schedule your next appointment   for a physical exam in 1 year.  Depending on your results I might ask you to come back sooner

## 2019-07-05 NOTE — Progress Notes (Signed)
Subjective:    Patient ID: Gary [name], male    DOB: 08-31-1956, 63 y.o.   MRN: [id number]  DOS:  07/05/2019 Type of visit - description: Here for CPX  Has no major concerns History of vertigo, much improved, 2-3 times a month develop some dizziness, short lived, not described as a spinning, typically triggered by following with his eyes car lights at night.   No associated headache, nausea, vomiting  Review of Systems  Other than above, a 14 point review of systems is negative     Past Medical History:  Diagnosis Date  . Diabetes-- A1c 6.7 (2016) 05/15/2015  . 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 Lipoma of arm 2016    Past Surgical History:  Procedure Laterality Date  . INGUINAL HERNIA REPAIR  2000   unilateral  . MASS EXCISION Left 08/04/2015   Procedure: EXCISION OF MASS ON LEFT ARM;  Surgeon: [name], MD;  Location: [facility];  Service: General;  Laterality: Left;    Social History   Socioeconomic History  . Marital status: Married    Spouse name: Not on file  . Number of children: 4  . Years of education: Not on file  . Highest education level: Not on file  Occupational History  . Occupation: city of [place], [place]  . Financial resource strain: Not on file  . Food insecurity    Worry: Not on file    Inability: Not on file  . Transportation needs    Medical: Not on file    Non-medical: Not on file  Tobacco Use  . Smoking status: Never Smoker  . Smokeless tobacco: Never Used  Substance and Sexual Activity  . Alcohol use: Not Currently    Alcohol/week: 0.0 standard drinks    Comment: rarely  . Drug use: No  . Sexual activity: Not on file  Lifestyle  . Physical activity    Days per week: Not on file    Minutes per session: Not on file  . Stress: Not on file  Relationships  . Social Herbalist on phone: Not on file    Gets together: Not on file    Attends religious service: Not on file    Active member of  club or organization: Not on file    Attends meetings of clubs or organizations: Not on file    Relationship status: Not on file  . Intimate partner violence    Fear of current or ex partner: Not on file    Emotionally abused: Not on file    Physically abused: Not on file    Forced sexual activity: Not on file  Other Topics Concern  . Not on file  Social History Narrative   Occupation: works for the [employer] (not sedentary)    lost wife 06-2014 , breast cancer   2 children  2001, 2003--- live w/ him        Family History  Problem Relation Age of Onset  . Hypertension Mother   . Diabetes Mother   . Colon cancer Father        <60  . Heart attack Neg Hx   . Prostate cancer Neg Hx      Allergies as of 07/05/2019   No Known Allergies     Medication List       Accurate as of July 05, 2019 11:59 PM. If you have any questions, ask your nurse or doctor.  aspirin EC 81 MG tablet Take 81 mg by mouth daily.   meclizine 12.5 MG tablet Commonly known as: ANTIVERT Take 1 tablet (12.5 mg total) by mouth 3 (three) times daily as needed for dizziness.           Objective:   Physical Exam Skin:        BP 127/86 (BP Location: Left Arm, Patient Position: Sitting, Cuff Size: Normal)   Pulse (!) 59   Temp (!) 97.3 F (36.3 C) (Temporal)   Resp 16   Ht 6\' 1"  (1.854 m)   Wt 288 lb (130.6 kg)   SpO2 100%   BMI 38.00 kg/m       General: Well developed, NAD, BMI noted Neck: No  thyromegaly  HEENT:  Normocephalic . Face symmetric, atraumatic Lungs:  CTA B Normal respiratory effort, no intercostal retractions, no accessory muscle use. Heart: RRR,  no murmur.  No pretibial edema bilaterally  Abdomen:  Not distended, soft, non-tender. No rebound or rigidity.   DRE: Hard to reach prostate gland but seems normal and not tender Neurologic:  alert & oriented X3.  Speech normal, gait appropriate for age and unassisted Strength symmetric and appropriate for  age.  Psych: Cognition and judgment appear intact.  Cooperative with normal attention span and concentration.  Behavior appropriate. No anxious or depressed appearing.  Assessment    Assessment Diabetes, A1c 6.7 2006 Hyperlipidemia  Leiomyosarcoma, L arm  S/p excision 07-2015 , saw Dr Valere Dross, rec wider excision, pt d/w surgery and declined  OSA, dx 2020: uses CPAP  Vertigo: Admitted 12-2018, MRI head, neck negative.     PLAN: Here for CPX DM: Diet controlled, he is trying to do well with lifestyle.  Check A1c, micro Hyperlipidemia: Diet controlled, checking labs Vertigo: Improved, refilled Antivert. OSA: On CPAP Skin lesion: See physical exam, refer to dermatology. RTC 1 year, sooner depending on results   Today, in addition to his physical exam, I spent more than 15   min with the patient: >50% of the time counseling regards to his chronic medical problems including diabetes, hyperlipidemia, vertigo.  Also coordinating patient's care, referring him to dermatology.

## 2019-07-05 NOTE — Progress Notes (Signed)
Pre visit review using our clinic review tool, if applicable. No additional management support is needed unless otherwise documented below in the visit note. 

## 2019-07-06 DIAGNOSIS — E785 Hyperlipidemia, unspecified: Secondary | ICD-10-CM | POA: Insufficient documentation

## 2019-07-06 NOTE — Assessment & Plan Note (Signed)
Here for CPX DM: Diet controlled, he is trying to do well with lifestyle.  Check A1c, micro Hyperlipidemia: Diet controlled, checking labs Vertigo: Improved, refilled Antivert. OSA: On CPAP Skin lesion: See physical exam, refer to dermatology. RTC 1 year, sooner depending on results

## 2019-07-11 ENCOUNTER — Telehealth: Payer: Self-pay | Admitting: Internal Medicine

## 2019-07-11 ENCOUNTER — Encounter: Payer: Self-pay | Admitting: Internal Medicine

## 2019-07-11 NOTE — Telephone Encounter (Signed)
Pt was mailed a colon recall letter in 2016 so it appears he is past due.

## 2019-07-11 NOTE — Telephone Encounter (Signed)
Pt had previous colonoscopy by Dr. Carlean Purl in 2011.  There is a referral for him to schedule a colon.  I don't see a recall date in colon report.  Is he due?

## 2019-08-01 ENCOUNTER — Other Ambulatory Visit: Payer: Self-pay

## 2019-08-01 ENCOUNTER — Encounter: Payer: Self-pay | Admitting: Internal Medicine

## 2019-08-01 ENCOUNTER — Ambulatory Visit (AMBULATORY_SURGERY_CENTER): Payer: Self-pay

## 2019-08-01 VITALS — Temp 96.8°F | Ht 73.0 in | Wt 296.8 lb

## 2019-08-01 DIAGNOSIS — Z800 Family history of malignant neoplasm of digestive organs: Secondary | ICD-10-CM

## 2019-08-01 NOTE — Progress Notes (Signed)
Denies allergies to eggs or soy products. Denies complication of anesthesia or sedation. Denies use of weight loss medication. Denies use of O2.   Emmi instructions given for colonoscopy.  Patient is scheduled for Covid Screening on 08/12/19 @ 8:25 Am. Patient verbalizes understanding of instructions.

## 2019-08-12 ENCOUNTER — Other Ambulatory Visit: Payer: Self-pay | Admitting: Internal Medicine

## 2019-08-13 LAB — SARS CORONAVIRUS 2 (TAT 6-24 HRS): SARS Coronavirus 2: NEGATIVE

## 2019-08-15 ENCOUNTER — Ambulatory Visit (AMBULATORY_SURGERY_CENTER): Payer: 59 | Admitting: Internal Medicine

## 2019-08-15 ENCOUNTER — Other Ambulatory Visit: Payer: Self-pay

## 2019-08-15 ENCOUNTER — Encounter: Payer: Self-pay | Admitting: Internal Medicine

## 2019-08-15 VITALS — BP 155/97 | HR 58 | Temp 98.8°F | Resp 13 | Ht 73.0 in | Wt 296.0 lb

## 2019-08-15 DIAGNOSIS — Z1211 Encounter for screening for malignant neoplasm of colon: Secondary | ICD-10-CM | POA: Diagnosis not present

## 2019-08-15 DIAGNOSIS — Z800 Family history of malignant neoplasm of digestive organs: Secondary | ICD-10-CM

## 2019-08-15 MED ORDER — SODIUM CHLORIDE 0.9 % IV SOLN: 500.0000 mL | Freq: Once | INTRAVENOUS | Status: DC

## 2019-08-15 NOTE — Progress Notes (Signed)
PT taken to PACU. Monitors in place. VSS. Report given to RN. 

## 2019-08-15 NOTE — Patient Instructions (Addendum)
No polyps or cancer seen - normal exam I am glad to say.  Your next routine colonoscopy should be in 5 years - 2025.  I appreciate the opportunity to care for you. Gatha Mayer, MD, FACG  YOU HAD AN ENDOSCOPIC PROCEDURE TODAY AT [facility]:   Refer to the procedure report that was given to you for any specific questions about what was found during the examination.  If the procedure report does not answer your questions, please call your gastroenterologist to clarify.  If you requested that your care partner not be given the details of your procedure findings, then the procedure report has been included in a sealed envelope for you to review at your convenience later.  YOU SHOULD EXPECT: Some feelings of bloating in the abdomen. Passage of more gas than usual.  Walking can help get rid of the air that was put into your GI tract during the procedure and reduce the bloating. If you had a lower endoscopy (such as a colonoscopy or flexible sigmoidoscopy) you may notice spotting of blood in your stool or on the toilet paper. If you underwent a bowel prep for your procedure, you may not have a normal bowel movement for a few days.  Please Note:  You might notice some irritation and congestion in your nose or some drainage.  This is from the oxygen used during your procedure.  There is no need for concern and it should clear up in a day or so.  SYMPTOMS TO REPORT IMMEDIATELY:   Following lower endoscopy (colonoscopy or flexible sigmoidoscopy):  Excessive amounts of blood in the stool  Significant tenderness or worsening of abdominal pains  Swelling of the abdomen that is new, acute  Fever of 100F or higher  For urgent or emergent issues, a gastroenterologist can be reached at any hour by calling [phone].   DIET:  We do recommend a small meal at first, but then you may proceed to your regular diet.  Drink plenty of fluids but you should avoid alcoholic beverages for 24  hours.  ACTIVITY:  You should plan to take it easy for the rest of today and you should NOT DRIVE or use heavy machinery until tomorrow (because of the sedation medicines used during the test).    FOLLOW UP: Our staff will call the number listed on your records 48-72 hours following your procedure to check on you and address any questions or concerns that you may have regarding the information given to you following your procedure. If we do not reach you, we will leave a message.  We will attempt to reach you two times.  During this call, we will ask if you have developed any symptoms of COVID 19. If you develop any symptoms (ie: fever, flu-like symptoms, shortness of breath, cough etc.) before then, please call [phone].  If you test positive for Covid 19 in the 2 weeks post procedure, please call and report this information to [place].    If any biopsies were taken you will be contacted by phone or by letter within the next 1-3 weeks.  Please call us at [phone] if you have not heard about the biopsies in 3 weeks.    SIGNATURES/CONFIDENTIALITY: You and/or your care partner have signed paperwork which will be entered into your electronic medical record.  These signatures attest to the fact that that the information above on your After Visit Summary has been reviewed and is understood.  Full responsibility of  the confidentiality of this discharge information lies with you and/or your care-partner. 

## 2019-08-15 NOTE — Progress Notes (Signed)
Pt's states no medical or surgical changes since previsit or office visit. VS by [place]. Temp by Hiram Comber

## 2019-08-15 NOTE — Op Note (Signed)
[place] Patient Name: Adonai Colbeck Procedure Date: 08/15/2019 11:08 AM MRN: [id number] Endoscopist: Gatha Mayer , MD Age: 63 Referring MD:  Date of Birth: 1956-07-23 Gender: Male Account #: [id number] Procedure:                Colonoscopy Indications:              Screening in patient at increased risk: Family                            history of 1st-degree relative with colorectal                            cancer Medicines:                Propofol per Anesthesia, Monitored Anesthesia Care Procedure:                Pre-Anesthesia Assessment:                           - Prior to the procedure, a History and Physical                            was performed, and patient medications and                            allergies were reviewed. The patient's tolerance of                            previous anesthesia was also reviewed. The risks                            and benefits of the procedure and the sedation                            options and risks were discussed with the patient.                            All questions were answered, and informed consent                            was obtained. Prior Anticoagulants: The patient has                            taken no previous anticoagulant or antiplatelet                            agents. ASA Grade Assessment: II - A patient with                            mild systemic disease. After reviewing the risks                            and benefits, the patient was deemed in  satisfactory condition to undergo the procedure.                           After obtaining informed consent, the colonoscope                            was passed under direct vision. Throughout the                            procedure, the patient's blood pressure, pulse, and                            oxygen saturations were monitored continuously. The                            Colonoscope was introduced through the  anus and                            advanced to the the cecum, identified by                            appendiceal orifice and ileocecal valve. The                            quality of the bowel preparation was excellent. The                            colonoscopy was performed without difficulty. The                            patient tolerated the procedure well. The bowel                            preparation used was Miralax via split dose                            instruction. Scope In: 11:13:45 AM Scope Out: 11:23:25 AM Scope Withdrawal Time: 0 hours 7 minutes 1 second  Total Procedure Duration: 0 hours 9 minutes 40 seconds  Findings:                 The perianal and digital rectal examinations were                            normal. Pertinent negatives include normal prostate                            (size, shape, and consistency).                           The colon (entire examined portion) appeared normal.                           No additional abnormalities were found on  retroflexion. Complications:            No immediate complications. Estimated blood loss:                            None. Estimated Blood Loss:     Estimated blood loss: none. Impression:               - The entire examined colon is normal.                           - No specimens collected.                           Family Hx colon cancer father age < 68 Recommendation:           - Repeat colonoscopy in 5 years for screening                            purposes.                           - Patient has a contact number available for                            emergencies. The signs and symptoms of potential                            delayed complications were discussed with the                            patient. Return to normal activities tomorrow.                            Written discharge instructions were provided to the                            patient.                            - Resume previous diet.                           - Continue present medications. Gatha Mayer, MD 08/15/2019 11:30:27 AM This report has been signed electronically.

## 2019-08-19 ENCOUNTER — Telehealth: Payer: Self-pay

## 2019-08-19 NOTE — Telephone Encounter (Signed)
   Follow up Call-  Call back number 08/15/2019  Post procedure Call Back phone  # [phone]  Permission to leave phone message Yes  Some recent data might be hidden     Patient questions:  Do you have a fever, pain , or abdominal swelling? No. Pain Score  0 *  Have you tolerated food without any problems? Yes.    Have you been able to return to your normal activities? Yes.    Do you have any questions about your discharge instructions: Diet   No. Medications  No. Follow up visit  No.  Do you have questions or concerns about your Care? No.  Actions: * If pain score is 4 or above: No action needed, pain <4.   1. Have you developed a fever since your procedure? No  2.   Have you had an respiratory symptoms (SOB or cough) since your procedure? No 3.   Have you tested positive for COVID 19 since your procedure No  4.   Have you had any family members/close contacts diagnosed with the COVID 19 since your procedure?  No   If yes to any of these questions please route to Joylene John, RN and Alphonsa Gin, RN.

## 2019-09-14 ENCOUNTER — Encounter (HOSPITAL_BASED_OUTPATIENT_CLINIC_OR_DEPARTMENT_OTHER): Payer: Self-pay | Admitting: Emergency Medicine

## 2019-09-14 ENCOUNTER — Emergency Department (HOSPITAL_BASED_OUTPATIENT_CLINIC_OR_DEPARTMENT_OTHER)
Admission: EM | Admit: 2019-09-14 | Discharge: 2019-09-14 | Disposition: A | Discharge status: Home / Self Care | Payer: 59 | Attending: Emergency Medicine | Admitting: Emergency Medicine

## 2019-09-14 ENCOUNTER — Other Ambulatory Visit: Payer: Self-pay

## 2019-09-14 DIAGNOSIS — S39012A Strain of muscle, fascia and tendon of lower back, initial encounter: Secondary | ICD-10-CM | POA: Diagnosis not present

## 2019-09-14 DIAGNOSIS — M25561 Pain in right knee: Secondary | ICD-10-CM | POA: Diagnosis not present

## 2019-09-14 DIAGNOSIS — M62830 Muscle spasm of back: Secondary | ICD-10-CM | POA: Diagnosis not present

## 2019-09-14 DIAGNOSIS — Y92410 Unspecified street and highway as the place of occurrence of the external cause: Secondary | ICD-10-CM | POA: Diagnosis not present

## 2019-09-14 DIAGNOSIS — Y93I9 Activity, other involving external motion: Secondary | ICD-10-CM | POA: Diagnosis not present

## 2019-09-14 DIAGNOSIS — Y998 Other external cause status: Secondary | ICD-10-CM | POA: Insufficient documentation

## 2019-09-14 DIAGNOSIS — S3992XA Unspecified injury of lower back, initial encounter: Secondary | ICD-10-CM | POA: Diagnosis present

## 2019-09-14 MED ORDER — IBUPROFEN 600 MG PO TABS: 600.0000 mg | ORAL_TABLET | Freq: Four times a day (QID) | ORAL | 0 refills | Status: DC | PRN

## 2019-09-14 MED ORDER — CYCLOBENZAPRINE HCL 10 MG PO TABS: 10.0000 mg | ORAL_TABLET | Freq: Two times a day (BID) | ORAL | 0 refills | Status: DC | PRN
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
## 2020-05-01 ENCOUNTER — Encounter: Payer: Self-pay | Admitting: Internal Medicine

## 2020-05-07 LAB — HM DIABETES EYE EXAM

## 2020-05-08 ENCOUNTER — Encounter: Payer: Self-pay | Admitting: Internal Medicine

## 2020-07-10 ENCOUNTER — Encounter: Payer: 59 | Admitting: Internal Medicine

## 2020-07-20 ENCOUNTER — Ambulatory Visit (INDEPENDENT_AMBULATORY_CARE_PROVIDER_SITE_OTHER): Payer: 59 | Admitting: Internal Medicine

## 2020-07-20 ENCOUNTER — Other Ambulatory Visit: Payer: Self-pay

## 2020-07-20 ENCOUNTER — Encounter: Payer: Self-pay | Admitting: Internal Medicine

## 2020-07-20 VITALS — BP 121/79 | HR 55 | Temp 98.1°F | Resp 16 | Ht 73.0 in | Wt 287.2 lb

## 2020-07-20 DIAGNOSIS — E119 Type 2 diabetes mellitus without complications: Secondary | ICD-10-CM | POA: Diagnosis not present

## 2020-07-20 DIAGNOSIS — Z0000 Encounter for general adult medical examination without abnormal findings: Secondary | ICD-10-CM

## 2020-07-20 DIAGNOSIS — E785 Hyperlipidemia, unspecified: Secondary | ICD-10-CM

## 2020-07-20 MED ORDER — KETOCONAZOLE 2 % EX CREA: 1.0000 "application " | TOPICAL_CREAM | Freq: Every day | CUTANEOUS | 1 refills | Status: AC

## 2020-07-20 NOTE — Patient Instructions (Signed)
I sent the cream you requested, use it for few days as needed  Please go see the get that booster for COVID-19 and a flu shot this year  GO TO THE LAB : Get the blood work     [place], Fel[place] back for   a physical exam in 1 year

## 2020-07-20 NOTE — Progress Notes (Signed)
Subjective:    Patient ID: Gary [name], male    DOB: Jul 12, 1956, 64 y.o.   MRN: [id number]  DOS:  07/20/2020 Type of visit - description: CPX Since last year he is doing well. Has no concerns other than occasional rash between the toes, request a refill on a antifungal cream.  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 If you have any questions, ask your nurse or doctor.        aspirin EC 81 MG tablet Take 81 mg by mouth daily.   cyclobenzaprine 10 MG tablet Commonly known as: FLEXERIL Take 1 tablet (10 mg total) by mouth 2 (two) times daily as needed for up to 20 doses for muscle spasms.   ibuprofen 600 MG tablet Commonly known as: ADVIL Take 1 tablet (600 mg total) by mouth every 6 (six) hours as needed for up to 30 doses.   meclizine 12.5 MG tablet Commonly known as: ANTIVERT Take 1 tablet (12.5 mg total) by mouth 3 (three) times daily as needed for dizziness.   OVER THE COUNTER MEDICATION Magnesium,  6 drops daily.   OVER THE COUNTER MEDICATION Chage-Black Cumin , 6 drops daily.   PRESCRIPTION MEDICATION NASAL SPAY UNKNOWN NAME          Objective:   Physical Exam Temp 98.1 F (36.7 C) (Oral)    Resp 16    Ht 6\' 1"  (1.854 m)    Wt 287 lb 4 oz (130.3 kg)    BMI 37.90 kg/m  General: Well  developed, NAD, BMI noted Neck: No  thyromegaly  HEENT:  Normocephalic . Face symmetric, atraumatic Lungs:  CTA B Normal respiratory effort, no intercostal retractions, no accessory muscle use. Heart: RRR,  no murmur.  Abdomen:  Not distended, soft, non-tender. No rebound or rigidity.   DM foot exam: Good pedal pulses, no edema, pinprick examination normal, skin normal. Skin: Exposed areas without rash. Not pale. Not jaundice Neurologic:  alert & oriented X3.  Speech normal, gait appropriate for age and unassisted Strength symmetric and appropriate for age.  Psych: Cognition and judgment appear intact.  Cooperative with normal attention span and concentration.  Behavior appropriate. No anxious or depressed appearing.     Assessment     Assessment Diabetes, A1c 6.7 2006 Hyperlipidemia  Leiomyosarcoma, L arm  S/p excision 07-2015 , saw Dr Valere Dross, rec wider excision, pt d/w surgery and declined  OSA, dx 2020: uses CPAP  Vertigo: Admitted 12-2018, MRI head, neck negative.     PLAN: Here for CPX DM: Diet controlled, check A1c and micro, denies paresthesias, feet exam normal. Hyperlipidemia: Diet controlled, checking labs OSA: Reports good CPAP  compliance Ketoconazole: RF provided, uses for fungal dermatitis at the toes as needed RTC 1 year  This visit occurred during the SARS-CoV-2 public health emergency.  Safety protocols were in place, including screening questions prior to the visit, additional usage of staff PPE, and extensive cleaning of exam room while observing appropriate contact time as indicated for disinfecting solutions.

## 2020-07-20 NOTE — Progress Notes (Signed)
Pre visit review using our clinic review tool, if applicable. No additional management support is needed unless otherwise documented below in the visit note. 

## 2020-07-21 ENCOUNTER — Encounter: Payer: Self-pay | Admitting: Internal Medicine

## 2020-07-21 LAB — COMPREHENSIVE METABOLIC PANEL
AG Ratio: 1.5 (calc) (ref 1.0–2.5)
ALT: 19 U/L (ref 9–46)
AST: 18 U/L (ref 10–35)
Albumin: 4.4 g/dL (ref 3.6–5.1)
Alkaline phosphatase (APISO): 48 U/L (ref 35–144)
BUN/Creatinine Ratio: 15 (calc) (ref 6–22)
BUN: 20 mg/dL (ref 7–25)
CO2: 25 mmol/L (ref 20–32)
Calcium: 9.4 mg/dL (ref 8.6–10.3)
Chloride: 104 mmol/L (ref 98–110)
Creat: 1.31 mg/dL — ABNORMAL HIGH (ref 0.70–1.25)
Globulin: 3 g/dL (calc) (ref 1.9–3.7)
Glucose, Bld: 90 mg/dL (ref 65–99)
Potassium: 4.5 mmol/L (ref 3.5–5.3)
Sodium: 139 mmol/L (ref 135–146)
Total Bilirubin: 1 mg/dL (ref 0.2–1.2)
Total Protein: 7.4 g/dL (ref 6.1–8.1)

## 2020-07-21 LAB — CBC WITH DIFFERENTIAL/PLATELET
Absolute Monocytes: 366 cells/uL (ref 200–950)
Basophils Absolute: 32 cells/uL (ref 0–200)
Basophils Relative: 0.6 %
Eosinophils Absolute: 69 cells/uL (ref 15–500)
Eosinophils Relative: 1.3 %
HCT: 46.2 % (ref 38.5–50.0)
Hemoglobin: 15.1 g/dL (ref 13.2–17.1)
Lymphs Abs: 1707 cells/uL (ref 850–3900)
MCH: 27.3 pg (ref 27.0–33.0)
MCHC: 32.7 g/dL (ref 32.0–36.0)
MCV: 83.5 fL (ref 80.0–100.0)
MPV: 10.1 fL (ref 7.5–12.5)
Monocytes Relative: 6.9 %
Neutro Abs: 3127 cells/uL (ref 1500–7800)
Neutrophils Relative %: 59 %
Platelets: 244 10*3/uL (ref 140–400)
RBC: 5.53 10*6/uL (ref 4.20–5.80)
RDW: 13.5 % (ref 11.0–15.0)
Total Lymphocyte: 32.2 %
WBC: 5.3 10*3/uL (ref 3.8–10.8)

## 2020-07-21 LAB — HEMOGLOBIN A1C
Hgb A1c MFr Bld: 6 % of total Hgb — ABNORMAL HIGH (ref <5.7)
Mean Plasma Glucose: 126 (calc)
eAG (mmol/L): 7 (calc)

## 2020-07-21 LAB — LIPID PANEL
Cholesterol: 238 mg/dL — ABNORMAL HIGH (ref <200)
HDL: 50 mg/dL (ref >=40)
LDL Cholesterol (Calc): 161 mg/dL (calc) — ABNORMAL HIGH
Non-HDL Cholesterol (Calc): 188 mg/dL (calc) — ABNORMAL HIGH (ref <130)
Total CHOL/HDL Ratio: 4.8 (calc) (ref <5.0)
Triglycerides: 138 mg/dL (ref <150)

## 2020-07-21 LAB — MICROALBUMIN / CREATININE URINE RATIO
Creatinine, Urine: 57 mg/dL (ref 20–320)
Microalb Creat Ratio: 18 mcg/mg creat (ref <30)
Microalb, Ur: 1 mg/dL

## 2020-07-21 NOTE — Assessment & Plan Note (Signed)
Here for CPX DM: Diet controlled, check A1c and micro, denies paresthesias, feet exam normal. Hyperlipidemia: Diet controlled, checking labs OSA: Reports good CPAP compliance Ketoconazole: RF provided, uses for fungal dermatitis at the toes as needed RTC 1 year

## 2020-07-21 NOTE — Assessment & Plan Note (Signed)
-  Td 06-2019 - pnm 23: 2016 -S/p modern COVID-19 shot April 2021.  Encourage booster, he will think about it. - shingrex: d/w pt again, declined  - flu shot  : Declined -CCS: +FH Cscope 2006 (-) no polyps,  Cscope 07-2010 (-); cscope 08/2019, next per GI -prostate ca screening: DRE/PSA wnl 2020. -Diet  exercise: Discussed -Labs: CMP, FLP, CBC, A1c, micro

## 2020-09-21 IMAGING — MR MR MRA NECK WO/W CM
13 of 15 series · 39 of 48 positions shown · IV contrast (gadavist)
Comparison: Head CT 02/10/2016

CLINICAL DATA: Ataxia.  Dizziness and vomiting.

EXAM:
MR HEAD WITHOUT CONTRAST
MR CIRCLE OF WILLIS WITHOUT CONTRAST
MRA OF THE NECK WITHOUT AND WITH CONTRAST
TECHNIQUE: Multiplanar, multiecho pulse sequences of the brain, circle of
willis and surrounding structures were obtained without intravenous
contrast. Angiographic images of the neck were obtained using MRA
technique without and with intravenous contrast.
CONTRAST:  10 mL Gadavist

[Series 5: DWI · axial · 3.0mm · 0.88mm/px · z∈[-62,+77]mm · 5 of 96 slices shown (1 of 4)]
[im 1/96]
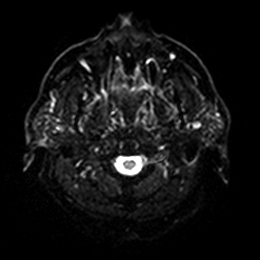
[im 24/96]
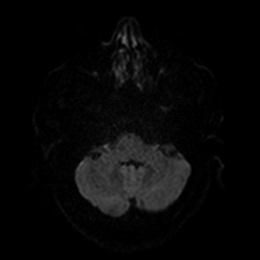
[im 48/96]
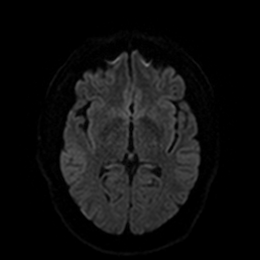
[im 72/96]
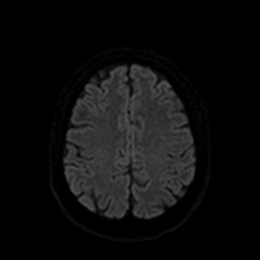
[im 96/96]
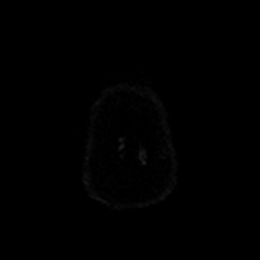

[Series 6: DWI · axial · 3.0mm · 0.88mm/px · z∈[-62,+77]mm · 2 of 48 slices shown (2 of 4)]
[im 1/48]
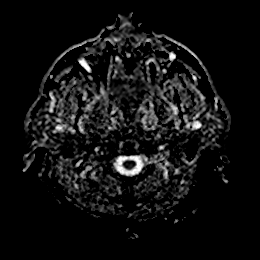
[im 48/48]
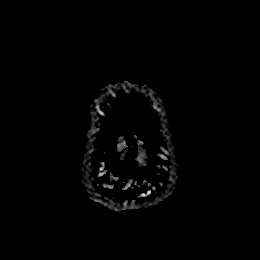

[Series 7: DWI · coronal · 4.0mm · 0.88mm/px · 3 of 64 slices shown (3 of 4)]
[im 1/64]
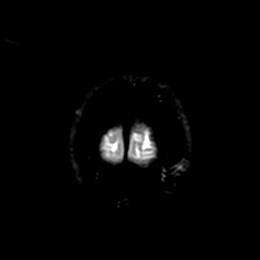
[im 32/64]
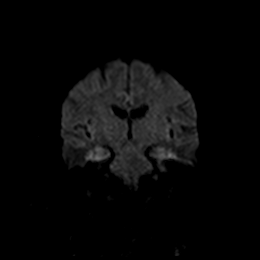
[im 64/64]
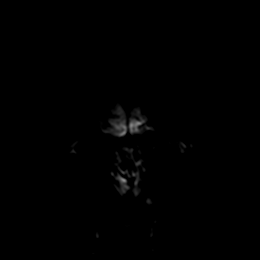

[Series 8: DWI · coronal · 4.0mm · 0.88mm/px · 2 of 32 slices shown (4 of 4)]
[im 1/32]
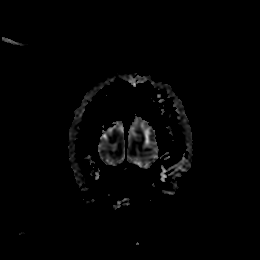
[im 32/32]
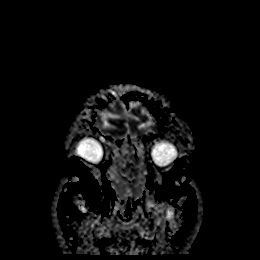

[Series 13: T1 · sagittal · 5.0mm · 0.75mm/px · 1 of 23 slices shown]
[im 1/23]
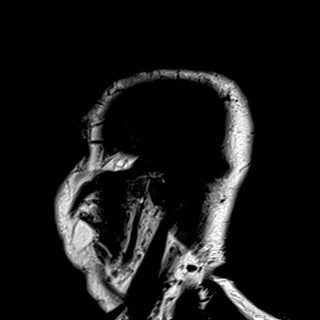

[Series 14: T2 · axial · 5.0mm · 0.72mm/px · 1 of 25 slices shown (1 of 2)]
[im 1/25]
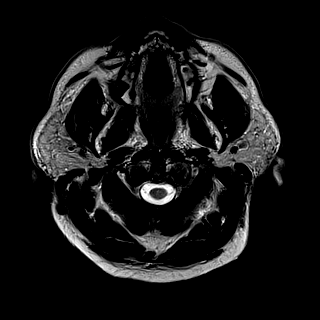

[Series 15: FLAIR · axial · 5.0mm · 0.45mm/px · 1 of 25 slices shown]
[im 1/25]
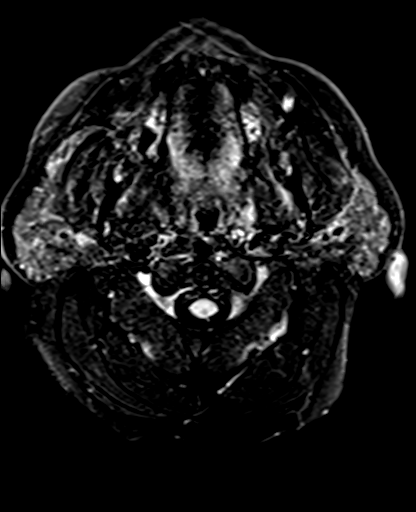

[Series 16: mag_images · axial · 3.0mm · 0.90mm/px · z∈[-65,+86]mm · 3 of 52 slices shown]
[im 1/52]
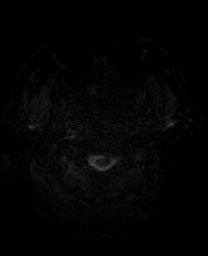
[im 26/52]
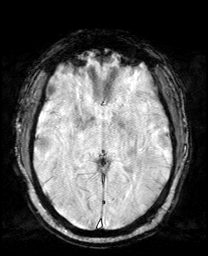
[im 52/52]
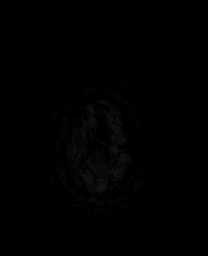

[Series 17: pha_images · axial · 3.0mm · 0.90mm/px · z∈[-65,+86]mm · 3 of 52 slices shown]
[im 1/52]
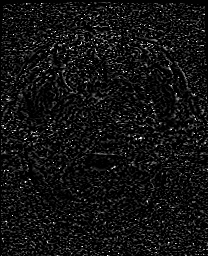
[im 26/52]
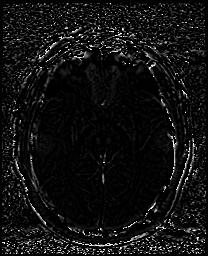
[im 52/52]
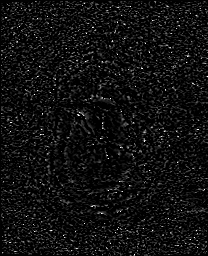

[Series 18: swi_images · axial · 3.0mm · 0.90mm/px · z∈[-65,+86]mm · 3 of 52 slices shown]
[im 1/52]
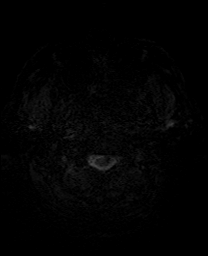
[im 26/52]
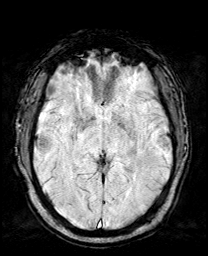
[im 52/52]
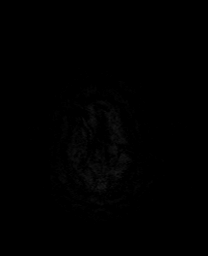

[Series 21: T2 · coronal · 5.0mm · 0.72mm/px · 1 of 28 slices shown (2 of 2)]
[im 1/28]
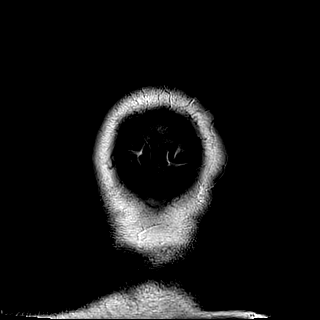

[Series 27: tof_fl3d_tra_iso · axial · 0.6mm · 0.52mm/px · z∈[-213,-93]mm · 10 of 201 slices shown]
[im 1/201]
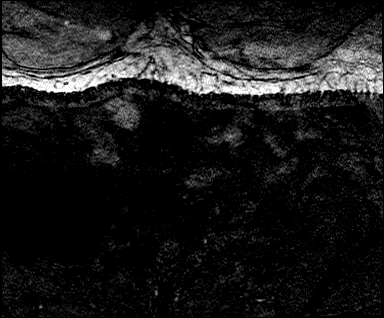
[im 23/201]
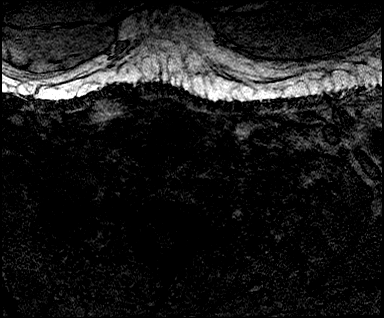
[im 45/201]
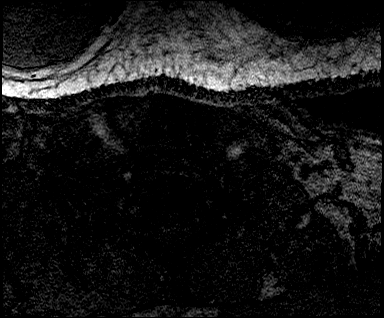
[im 67/201]
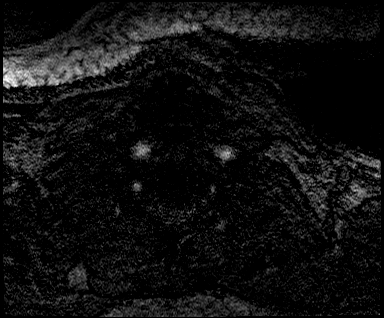
[im 89/201]
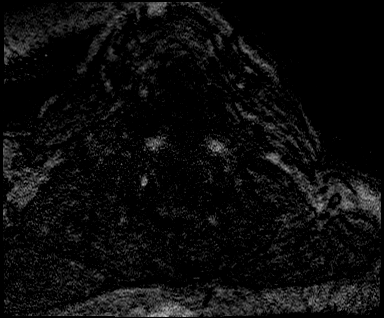
[im 112/201]
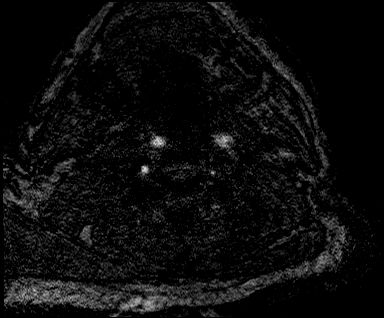
[im 134/201]
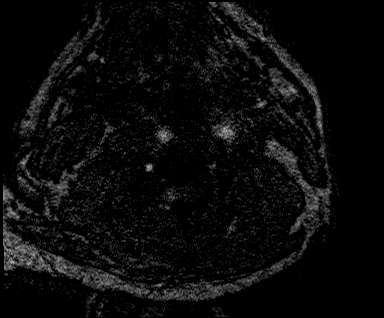
[im 156/201]
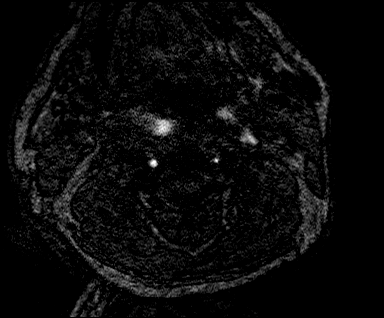
[im 178/201]
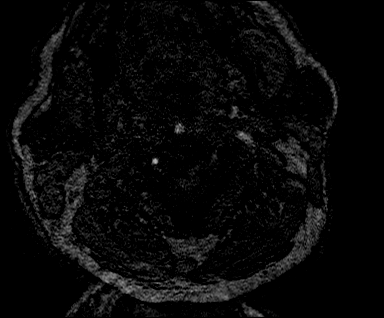
[im 201/201]
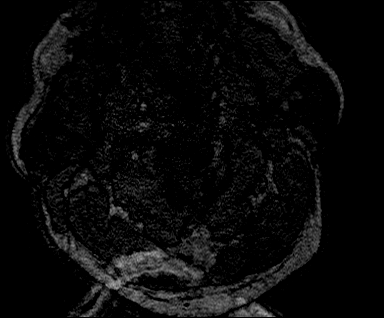

[Series 34: angio_fl3d_cor_post_ttc=3.0s_moco-adv_sub · coronal · 0.9mm · 0.85mm/px · 4 of 80 slices shown]
[im 1/80]
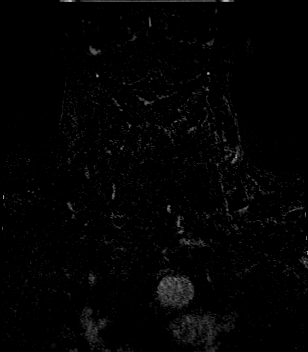
[im 27/80]
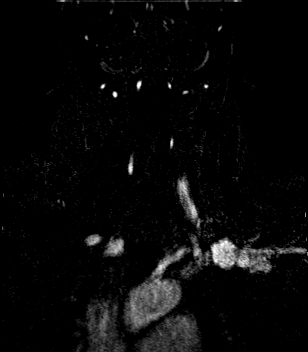
[im 53/80]
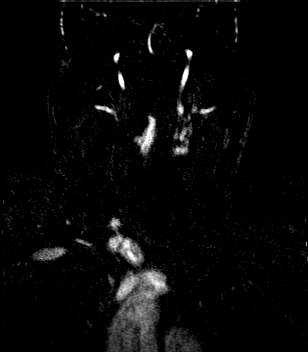
[im 80/80]
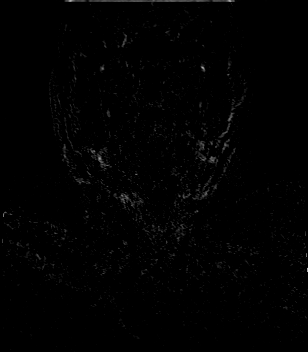

[39 of 48 positions shown; findings below may reference images not displayed]

FINDINGS: MR HEAD FINDINGS

Brain: There is no evidence of acute infarct, intracranial
hemorrhage, mass, midline shift, or extra-axial fluid collection.
The ventricles and sulci are normal. A few punctate foci of T2
hyperintensity in the cerebral white matter are nonspecific and not
greater than expected for age. The cerebellum is unremarkable.

Vascular: Major intracranial vascular flow voids are preserved.

Skull and upper cervical spine: No suspicious marrow lesion.

Sinuses/Orbits: Unremarkable orbits. Mild mucosal thickening in the
paranasal sinuses. No significant mastoid fluid.

Other: None.

MR CIRCLE OF WILLIS FINDINGS

The study is mildly motion degraded.

The visualized distal vertebral arteries are patent with the right
being dominant. The left vertebral artery is diminutive distal to
the PICA origin. The basilar artery is widely patent. There is a
small left posterior communicating artery with an infundibulum at
its origin. The right PCA is patent without evidence of significant
proximal stenosis. There is a moderate left P1 stenosis. There is
diminished signal throughout the left P2 segment which is felt to at
least be partly artifactual given patency on the separate
contrast-enhanced neck MRA, however assessment for an underlying
stenosis is limited.

The internal carotid arteries are patent from skull base to carotid
termini without evidence of significant stenosis. Focal signal loss
in the right ICA near the petrous-cavernous junction is artifactual
given normal appearance on the neck MRA. ACAs and MCAs are patent
with branch vessel irregularity and mild stenosis at the left MCA
bifurcation. No aneurysm is identified.

MRA NECK FINDINGS

The study is mildly to moderately motion degraded.

There is a standard 3 vessel aortic arch configuration. The
brachiocephalic and subclavian arteries are widely patent. Apparent
moderate long segment narrowing of the proximal left common carotid
artery is favored to be secondary to motion artifact although an
underlying stenosis is also possible. The right common carotid
artery is widely patent, as are both internal carotid arteries.

Antegrade flow is seen in both vertebral arteries. The right
vertebral artery is moderately to strongly dominant without evidence
of significant stenosis. The left V1 segment was incompletely imaged
on the postcontrast MRA and is not visualized on the noncontrast
time-of-flight MRA due to motion artifact and poor signal in this
region. The left V2 and V3 segments are patent without evidence of
flow limiting stenosis.
IMPRESSION: 1. Unremarkable appearance of the brain for age. No acute
intracranial abnormality.
2. Intracranial atherosclerosis including a moderate left P1
stenosis.
3. Motion degraded neck MRA with limited assessment of the proximal
left common carotid artery and proximal left vertebral artery. No
significant carotid or vertebral artery stenosis elsewhere in the
neck.

## 2020-10-15 ENCOUNTER — Other Ambulatory Visit (HOSPITAL_BASED_OUTPATIENT_CLINIC_OR_DEPARTMENT_OTHER): Payer: Self-pay | Admitting: Emergency Medicine

## 2020-10-15 ENCOUNTER — Encounter (HOSPITAL_BASED_OUTPATIENT_CLINIC_OR_DEPARTMENT_OTHER): Payer: Self-pay

## 2020-10-15 ENCOUNTER — Emergency Department (HOSPITAL_BASED_OUTPATIENT_CLINIC_OR_DEPARTMENT_OTHER)
Admission: EM | Admit: 2020-10-15 | Discharge: 2020-10-15 | Disposition: A | Discharge status: Home / Self Care | Payer: 59 | Attending: Emergency Medicine | Admitting: Emergency Medicine

## 2020-10-15 ENCOUNTER — Other Ambulatory Visit: Payer: Self-pay

## 2020-10-15 ENCOUNTER — Emergency Department (HOSPITAL_BASED_OUTPATIENT_CLINIC_OR_DEPARTMENT_OTHER): Payer: 59

## 2020-10-15 DIAGNOSIS — Y92512 Supermarket, store or market as the place of occurrence of the external cause: Secondary | ICD-10-CM | POA: Insufficient documentation

## 2020-10-15 DIAGNOSIS — E119 Type 2 diabetes mellitus without complications: Secondary | ICD-10-CM | POA: Insufficient documentation

## 2020-10-15 DIAGNOSIS — W01198A Fall on same level from slipping, tripping and stumbling with subsequent striking against other object, initial encounter: Secondary | ICD-10-CM | POA: Insufficient documentation

## 2020-10-15 DIAGNOSIS — Z7982 Long term (current) use of aspirin: Secondary | ICD-10-CM | POA: Insufficient documentation

## 2020-10-15 DIAGNOSIS — S8992XA Unspecified injury of left lower leg, initial encounter: Secondary | ICD-10-CM | POA: Diagnosis present

## 2020-10-15 DIAGNOSIS — S8392XA Sprain of unspecified site of left knee, initial encounter: Secondary | ICD-10-CM

## 2020-10-15 DIAGNOSIS — J45909 Unspecified asthma, uncomplicated: Secondary | ICD-10-CM | POA: Diagnosis not present

## 2020-10-15 MED ORDER — NAPROXEN 500 MG PO TABS: 500.0000 mg | ORAL_TABLET | Freq: Two times a day (BID) | ORAL | 0 refills | Status: DC

## 2020-10-15 MED FILL — NAPROXEN 500 MG TABS: 500 | 15 days supply | Qty: 30 | Fill #0

## 2020-10-15 NOTE — ED Notes (Signed)
Pt slipped and fell in [employer] yesterday and is complaining of left knee pain, left elbow pain with stiffness in lower back

## 2020-10-15 NOTE — ED Provider Notes (Signed)
[facility] EMERGENCY DEPARTMENT Provider Note  CSN: [id number] Arrival date & time: 10/15/20 0759    History Chief Complaint  Patient presents with  . Fall  . Knee Pain    HPI  Gary [name] is a 65 y.o. male reports he slipped and fell at the grocery store yesterday bending his L knee backwards and falling onto his left side. He reports he bumped his elbow and his head but did not have LOC. He took some aleve last night with some improvement but his knee was still hurting this morning so he came to the ED for evaluation. He is able to bear weight on the knee but it hits to flex past 90 degrees.    Past Medical History:  Diagnosis Date  . Asthma   . Depression   . Diabetes-- A1c 6.7 (2016) 05/15/2015   Patient denies  . 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 Lipoma of arm 2016  . Sleep apnea     Past Surgical History:  Procedure Laterality Date  . COLONOSCOPY    . INGUINAL HERNIA REPAIR  2000   unilateral  . MASS EXCISION Left 08/04/2015   Procedure: EXCISION OF MASS ON LEFT ARM;  Surgeon: [name], MD;  Location: [facility];  Service: General;  Laterality: Left;    Family History  Problem Relation Age of Onset  . Hypertension Mother   . Diabetes Mother   . Colon cancer Father        <60  . Heart attack Neg Hx   . Prostate cancer Neg Hx   . Esophageal cancer Neg Hx   . Rectal cancer Neg Hx   . Stomach cancer Neg Hx     Social History   Tobacco Use  . Smoking status: Never Smoker  . Smokeless tobacco: Never Used  Vaping Use  . Vaping Use: Never used  Substance Use Topics  . Alcohol use: Yes    Alcohol/week: 0.0 standard drinks    Comment: rarely  . Drug use: No     Home Medications Prior to Admission medications   Medication Sig Start Date End Date Taking? Authorizing Provider  naproxen (NAPROSYN) 500 MG tablet Take 1 tablet (500 mg total) by mouth 2 (two) times daily. 10/15/20  Yes Truddie Hidden, MD  aspirin EC 81  MG tablet Take 81 mg by mouth daily.    [provider]  ketoconazole (NIZORAL) 2 % cream Apply 1 application topically daily. 07/20/20   Colon Branch, MD  OVER THE COUNTER MEDICATION Magnesium,  6 drops daily.    [provider]  OVER THE COUNTER MEDICATION Chage-Black Cumin , 6 drops daily.    [provider]  PRESCRIPTION MEDICATION NASAL SPAY UNKNOWN NAME    [provider]  sildenafil (REVATIO) 20 MG tablet SMARTSIG:3 Tablet(s) By Mouth PRN 06/14/20   [provider]     Allergies    Patient has no known allergies.   Review of Systems   Review of Systems A comprehensive review of systems was completed and negative except as noted in HPI.    Physical Exam BP (!) 138/103 (BP Location: Right Arm)   Pulse 61   Temp 98.3 F (36.8 C) (Oral)   Resp 16   Ht 6\' 1"  (1.854 m)   Wt 133.8 kg   SpO2 96%   BMI 38.92 kg/m   Physical Exam Vitals and nursing note reviewed.  Constitutional:      Appearance: Normal  appearance.  HENT:     Head: Normocephalic and atraumatic.     Nose: Nose normal.     Mouth/Throat:     Mouth: Mucous membranes are moist.  Eyes:     Extraocular Movements: Extraocular movements intact.     Conjunctiva/sclera: Conjunctivae normal.  Cardiovascular:     Rate and Rhythm: Normal rate.  Pulmonary:     Effort: Pulmonary effort is normal.     Breath sounds: Normal breath sounds.  Abdominal:     General: Abdomen is flat.     Palpations: Abdomen is soft.     Tenderness: There is no abdominal tenderness.  Musculoskeletal:        General: Tenderness (tender over the anterior proximal tibial) present. No swelling.     Cervical back: Neck supple.     Comments: Mild decreased ROM of L knee  Skin:    General: Skin is warm and dry.  Neurological:     General: No focal deficit present.     Mental Status: He is alert.  Psychiatric:        Mood and Affect: Mood normal.      ED Results / Procedures / Treatments    Labs (all labs ordered are listed, but only abnormal results are displayed) Labs Reviewed - No data to display  EKG None   Radiology DG Knee Complete 4 Views Left  Result Date: 10/15/2020 CLINICAL DATA:  Knee pain post fall yesterday, pain and swelling, not inferior to patella EXAM: LEFT KNEE - COMPLETE 4+ VIEW COMPARISON:  None FINDINGS: Osseous demineralization. Tricompartmental joint space narrowing and spur formation. Patellar and tibial tubercle tendinous insertion spur formation. No acute fracture, dislocation, or bone destruction. No joint effusion. IMPRESSION: Tricompartmental osteoarthritic changes. No acute abnormalities. Electronically Signed   By: Ulyses Southward M.D.   On: 10/15/2020 08:38    Procedures Procedures  Medications Ordered in the ED Medications - No data to display   MDM Rules/Calculators/A&P MDM Xray images and results reviewed, arthritis without acute fracture. Patient will be placed in knee immobilizer for comfort. Continue NSAIDs for pain relief and referred to Ortho for further management.  ED Course  I have reviewed the triage vital signs and the nursing notes.  Pertinent labs & imaging results that were available during my care of the patient were reviewed by me and considered in my medical decision making (see chart for details).     Final Clinical Impression(s) / ED Diagnoses Final diagnoses:  Sprain of left knee, unspecified ligament, initial encounter    Rx / DC Orders ED Discharge Orders         Ordered    naproxen (NAPROSYN) 500 MG tablet  2 times daily        10/15/20 0846           Pollyann Savoy, MD 10/15/20 [phone]

## 2020-10-15 NOTE — ED Triage Notes (Signed)
Pt reports slipping and falling in [employer] yesterday states that he is hurting all over but the worst pain is in his left knee. Aleve at home last around 7pm last night.

## 2020-10-19 ENCOUNTER — Ambulatory Visit: Payer: 59 | Admitting: Family Medicine

## 2020-10-19 NOTE — Progress Notes (Deleted)
  Gary [name] - 65 y.o. male MRN [id number]  Date of birth: [birth year]  SUBJECTIVE:  Including CC & ROS.  No chief complaint on file.   Gary [name] is a 66 y.o. male that is  ***.  ***   Review of Systems See HPI   HISTORY: Past Medical, Surgical, Social, and Family History Reviewed & Updated per EMR.   Pertinent Historical Findings include:  Past Medical History:  Diagnosis Date  . Asthma   . Depression   . Diabetes-- A1c 6.7 (2016) 05/15/2015   Patient denies  . 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 Lipoma of arm 2016  . Sleep apnea     Past Surgical History:  Procedure Laterality Date  . COLONOSCOPY    . INGUINAL HERNIA REPAIR  2000   unilateral  . MASS EXCISION Left 08/04/2015   Procedure: EXCISION OF MASS ON LEFT ARM;  Surgeon: [name], MD;  Location: [facility];  Service: General;  Laterality: Left;    Family History  Problem Relation Age of Onset  . Hypertension Mother   . Diabetes Mother   . Colon cancer Father        <60  . Heart attack Neg Hx   . Prostate cancer Neg Hx   . Esophageal cancer Neg Hx   . Rectal cancer Neg Hx   . Stomach cancer Neg Hx     Social History   Socioeconomic History  . Marital status: Widowed    Spouse name: Not on file  . Number of children: 4  . Years of education: Not on file  . Highest education level: Not on file  Occupational History  . 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 Smoking status: Never Smoker  . Smokeless tobacco: Never Used  Vaping Use  . Vaping Use: Never used  Substance and Sexual Activity  . Alcohol use: Yes    Alcohol/week: 0.0 standard drinks    Comment: rarely  . Drug use: No  . Sexual activity: Not on file  Other Topics Concern  . Not on file  Social History Narrative   Retired-  works for the [employer] (not sedentary)    lost wife 06-2014 , breast cancer   House hold: pt and 2 children  2001, 2003    Social  Determinants of Radio broadcast assistant Strain: Not on [employer] Insecurity: Not on file  Transportation Needs: Not on file  Physical Activity: Not on file  Stress: Not on file  Social Connections: Not on file  Intimate Partner Violence: Not on file     PHYSICAL EXAM:  VS: There were no vitals taken for this visit. Physical Exam Gen: NAD, alert, cooperative with exam, well-appearing MSK:  ***      ASSESSMENT & PLAN:   No problem-specific Assessment & Plan notes found for this encounter.

## 2021-07-20 ENCOUNTER — Ambulatory Visit (INDEPENDENT_AMBULATORY_CARE_PROVIDER_SITE_OTHER): Payer: 59 | Admitting: Internal Medicine

## 2021-07-20 ENCOUNTER — Encounter: Payer: Self-pay | Admitting: Internal Medicine

## 2021-07-20 ENCOUNTER — Other Ambulatory Visit: Payer: Self-pay

## 2021-07-20 VITALS — BP 144/80 | HR 60 | Temp 98.2°F | Resp 18 | Ht 73.0 in | Wt 301.5 lb

## 2021-07-20 DIAGNOSIS — E119 Type 2 diabetes mellitus without complications: Secondary | ICD-10-CM | POA: Diagnosis not present

## 2021-07-20 DIAGNOSIS — Z0000 Encounter for general adult medical examination without abnormal findings: Secondary | ICD-10-CM | POA: Diagnosis not present

## 2021-07-20 DIAGNOSIS — E785 Hyperlipidemia, unspecified: Secondary | ICD-10-CM

## 2021-07-20 DIAGNOSIS — Z0100 Encounter for examination of eyes and vision without abnormal findings: Secondary | ICD-10-CM

## 2021-07-20 LAB — CBC WITH DIFFERENTIAL/PLATELET
Basophils Absolute: 0 10*3/uL (ref 0.0–0.1)
Basophils Relative: 0.8 % (ref 0.0–3.0)
Eosinophils Absolute: 0.1 10*3/uL (ref 0.0–0.7)
Eosinophils Relative: 1.2 % (ref 0.0–5.0)
HCT: 43 % (ref 39.0–52.0)
Hemoglobin: 14 g/dL (ref 13.0–17.0)
Lymphocytes Relative: 29.8 % (ref 12.0–46.0)
Lymphs Abs: 1.5 10*3/uL (ref 0.7–4.0)
MCHC: 32.5 g/dL (ref 30.0–36.0)
MCV: 84 fl (ref 78.0–100.0)
Monocytes Absolute: 0.3 10*3/uL (ref 0.1–1.0)
Monocytes Relative: 6.7 % (ref 3.0–12.0)
Neutro Abs: 3 10*3/uL (ref 1.4–7.7)
Neutrophils Relative %: 61.5 % (ref 43.0–77.0)
Platelets: 249 10*3/uL (ref 150.0–400.0)
RBC: 5.11 Mil/uL (ref 4.22–5.81)
RDW: 14.7 % (ref 11.5–15.5)
WBC: 4.9 10*3/uL (ref 4.0–10.5)

## 2021-07-20 LAB — HEMOGLOBIN A1C: Hgb A1c MFr Bld: 6.2 % (ref 4.6–6.5)

## 2021-07-20 LAB — LIPID PANEL
Cholesterol: 223 mg/dL — ABNORMAL HIGH (ref 0–200)
HDL: 47.2 mg/dL (ref >39.00)
NonHDL: 175.56
Total CHOL/HDL Ratio: 5
Triglycerides: 307 mg/dL — ABNORMAL HIGH (ref 0.0–149.0)
VLDL: 61.4 mg/dL — ABNORMAL HIGH (ref 0.0–40.0)

## 2021-07-20 LAB — COMPREHENSIVE METABOLIC PANEL
ALT: 22 U/L (ref 0–53)
AST: 17 U/L (ref 0–37)
Albumin: 4.2 g/dL (ref 3.5–5.2)
Alkaline Phosphatase: 48 U/L (ref 39–117)
BUN: 14 mg/dL (ref 6–23)
CO2: 25 mEq/L (ref 19–32)
Calcium: 9.3 mg/dL (ref 8.4–10.5)
Chloride: 103 mEq/L (ref 96–112)
Creatinine, Ser: 1.17 mg/dL (ref 0.40–1.50)
GFR: 66.5 mL/min (ref >60.00)
Glucose, Bld: 104 mg/dL — ABNORMAL HIGH (ref 70–99)
Potassium: 3.9 mEq/L (ref 3.5–5.1)
Sodium: 137 mEq/L (ref 135–145)
Total Bilirubin: 0.7 mg/dL (ref 0.2–1.2)
Total Protein: 7.2 g/dL (ref 6.0–8.3)

## 2021-07-20 LAB — MICROALBUMIN / CREATININE URINE RATIO
Creatinine,U: 32.9 mg/dL
Microalb Creat Ratio: 16.2 mg/g (ref 0.0–30.0)
Microalb, Ur: 5.3 mg/dL — ABNORMAL HIGH (ref 0.0–1.9)

## 2021-07-20 LAB — PSA: PSA: 0.84 ng/mL (ref 0.10–4.00)

## 2021-07-20 LAB — LDL CHOLESTEROL, DIRECT: Direct LDL: 106 mg/dL

## 2021-07-20 NOTE — Assessment & Plan Note (Signed)
-  Td 06-2019 - pnm 23: 2016 -Covid vax booster recommended, reluctant to proceed - shingrex: recommended , declined  - flu shot  : Declined -CCS: +FH Cscope 2006 (-) no polyps,  Cscope 07-2010 (-); cscope 08/2019: wnl, 5 years, has a FH -prostate ca screening: No symptoms, DRE limited but normal, check PSA -Diet  exercise: Discussed -Labs: CMP, FLP, CBC, A1c, micro, PSA

## 2021-07-20 NOTE — Progress Notes (Signed)
Subjective:    Patient ID: Gary [name], male    DOB: [birth year], 65 y.o.   MRN: [id number]  DOS:  07/20/2021 Type of visit - description: CPX  Since the last office visit, he has been seen at the [facility] multiple times. He has been in counseling for depression. Had physical therapy for back pain and was prescribed BP medication as well as vitamin D. 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   Lipoma of arm 2016   Sleep apnea     Past Surgical History:  Procedure Laterality Date   COLONOSCOPY     INGUINAL HERNIA REPAIR  2000   unilateral   MASS EXCISION Left 08/04/2015   Procedure: EXCISION OF MASS ON LEFT ARM;  Surgeon: [name], MD;  Location: [facility];  Service: General;  Laterality: Left;   Social History   Socioeconomic History   Marital status: Widowed    Spouse name: Not on file   Number of children: 4   Years of education: Not on file   Highest education level: Not on file  Occupational History   Occupation: retired    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  Tobacco Use   Smoking status: Never   Smokeless tobacco: Never  Vaping Use   Vaping Use: Never used  Substance and Sexual Activity   Alcohol use: Yes    Alcohol/week: 0.0 standard drinks    Comment: rarely   Drug use: No   Sexual activity: Not on file  Other Topics Concern   Not on file  Social History Narrative   Retired-  works for the [employer] (not sedentary)    lost wife 06-2014 , breast cancer   House hold: live by himself     Social Determinants of Health   Financial Resource Strain: Not on file  Food Insecurity: Not on file  Transportation Needs: Not on file  Physical Activity: Not on file   Stress: Not on file  Social Connections: Not on file  Intimate Partner Violence: Not on file    Allergies as of 07/20/2021   No Known Allergies      Medication List        Accurate as of July 20, 2021  7:40 PM. If you have any questions, ask your nurse or doctor.          aspirin EC 81 MG tablet Take 81 mg by mouth daily.   ketoconazole 2 % cream Commonly known as: NIZORAL Apply 1 application topically daily.   naproxen 500 MG tablet Commonly known as: NAPROSYN TAKE 1 TABLET (500 MG TOTAL) BY MOUTH 2 (TWO) TIMES DAILY.   OVER THE COUNTER MEDICATION Magnesium,  6 drops daily.   OVER THE COUNTER MEDICATION Chage-Black Cumin , 6 drops daily.   PRESCRIPTION MEDICATION NASAL SPAY UNKNOWN NAME   sildenafil 20 MG tablet Commonly known as: REVATIO SMARTSIG:3 Tablet(s) By Mouth PRN           Objective:   Physical Exam BP (!) 144/80 (BP Location: Left Arm, Patient Position: Sitting, Cuff Size: Normal)   Pulse 60   Temp 98.2 F (36.8 C) (Oral)  Resp 18   Ht 6\' 1"  (1.854 m)   Wt (!) 301 lb 8 oz (136.8 kg)   SpO2 97%   BMI 39.78 kg/m  General: Well developed, NAD, BMI noted Neck: No  thyromegaly  HEENT:  Normocephalic . Face symmetric, atraumatic Lungs:  CTA B Normal respiratory effort, no intercostal retractions, no accessory muscle use. Heart: RRR,  no murmur.  Abdomen:  Not distended, soft, non-tender. No rebound or rigidity. DRE: Very limited by patient habitus, brown stools, prostate seems normal Lower extremities: no pretibial edema bilaterally  Skin: Exposed areas without rash. Not pale. Not jaundice Neurologic:  alert & oriented X3.  Speech normal, gait appropriate for age and unassisted Strength symmetric and appropriate for age.  Psych: Cognition and judgment appear intact.  Cooperative with normal attention span and concentration.  Behavior appropriate. No anxious or depressed appearing.     Assessment        Assessment Diabetes, A1c 6.7 2006 Hyperlipidemia  Leiomyosarcoma, L arm  S/p excision 07-2015 , saw Dr Valere Dross, rec wider excision, pt d/w surgery and declined  OSA, dx 2020: uses CPAP  Vertigo: Admitted 12-2018, MRI head, neck negative.     PLAN: Here for CPX DM: Diet controlled, check A1c, micro. Hyperlipidemia: Has been reluctant to take medications.  Explained his CV RF is  31%.  He will benefit greatly from statins. [facility].  PHQ-9 today 6.  Taking a "sleeping aid".  Name?. - Elevated BP?  Is taking the medication, name?.  Recommend to follow-up with them - Back pain: Doing physical therapy there. - Vitamin D deficiency?  Was recommended to ergocalciferol. -recommend to see his [place] doctors regularly RTC 1 year     This visit occurred during the SARS-CoV-2 public health emergency.  Safety protocols were in place, including screening questions prior to the visit, additional usage of staff PPE, and extensive cleaning of exam room while observing appropriate contact time as indicated for disinfecting solutions.

## 2021-07-20 NOTE — Patient Instructions (Addendum)
Vaccinations are recommended: Flu shot Shingrix COVID-vaccine booster   We have referred you back to [facility] for your diabetic eye exam. Please expect a call from them to schedule a visit.   Check the  blood pressure once or twice a month BP GOAL is between 110/65 and  135/85. If it is consistently higher or lower, let me know  [facility]  GO TO THE LAB : Get the blood work     [place], [place] back for a physical exam in 1 year

## 2021-07-20 NOTE — Assessment & Plan Note (Signed)
Here for CPX DM: Diet controlled, check A1c, micro. Hyperlipidemia: Has been reluctant to take medications.  Explained his CV RF is  31%.  He will benefit greatly from statins. [facility] - Depression, on counseling.  PHQ-9 today 6.  Taking a "sleeping aid".  Name?. - Elevated BP?  Is taking the medication, name?.  Recommend to follow-up with them - Back pain: Doing physical therapy there. - Vitamin D deficiency?  Was recommended to ergocalciferol. -recommend to see his [place] doctors regularly RTC 1 year

## 2021-09-10 LAB — HM DIABETES EYE EXAM

## 2021-11-23 ENCOUNTER — Encounter: Payer: Self-pay | Admitting: Internal Medicine

## 2022-06-16 LAB — CBC AND DIFFERENTIAL
HCT: 40 — AB (ref 41–53)
Hemoglobin: 13.7 (ref 13.5–17.5)
Platelets: 264 10*3/uL (ref 150–400)

## 2022-06-16 LAB — BASIC METABOLIC PANEL
BUN: 15 (ref 4–21)
CO2: 24 — AB (ref 13–22)
Chloride: 111 — AB (ref 99–108)
Creatinine: 1.2 (ref 0.6–1.3)
Glucose: 101
Potassium: 3.7 mEq/L (ref 3.5–5.1)
Sodium: 140 (ref 137–147)

## 2022-06-16 LAB — HEPATIC FUNCTION PANEL
ALT: 19 U/L (ref 10–40)
AST: 35 (ref 14–40)
Alkaline Phosphatase: 53 (ref 25–125)
Bilirubin, Direct: 0.2 (ref 0.01–0.4)
Bilirubin, Total: 0.8

## 2022-06-16 LAB — COMPREHENSIVE METABOLIC PANEL
Albumin: 3.5 (ref 3.5–5.0)
Calcium: 8.4 — AB (ref 8.7–10.7)
eGFR: 70

## 2022-06-16 LAB — TESTOSTERONE: Testosterone: 235

## 2022-06-16 LAB — TSH: TSH: 0.71 (ref 0.41–5.90)

## 2022-06-16 LAB — VITAMIN B12: Vitamin B-12: 694

## 2022-06-16 LAB — LIPID PANEL
Cholesterol: 210 — AB (ref 0–200)
HDL: 50 (ref 35–70)
LDL Cholesterol: 126
Triglycerides: 170 — AB (ref 40–160)

## 2022-06-16 LAB — HEMOGLOBIN A1C: Hemoglobin A1C: 6.4

## 2022-06-16 LAB — VITAMIN D 25 HYDROXY (VIT D DEFICIENCY, FRACTURES): Vit D, 25-Hydroxy: 22

## 2022-06-27 LAB — HM DIABETES FOOT EXAM

## 2022-07-07 ENCOUNTER — Encounter: Payer: Self-pay | Admitting: Internal Medicine

## 2022-07-21 ENCOUNTER — Encounter: Payer: 59 | Admitting: Internal Medicine

## 2022-08-10 ENCOUNTER — Encounter: Payer: Self-pay | Admitting: Internal Medicine

## 2022-08-10 ENCOUNTER — Ambulatory Visit (INDEPENDENT_AMBULATORY_CARE_PROVIDER_SITE_OTHER): Payer: 59 | Admitting: Internal Medicine

## 2022-08-10 VITALS — BP 126/80 | HR 52 | Temp 97.9°F | Resp 18 | Ht 73.0 in | Wt 304.1 lb

## 2022-08-10 DIAGNOSIS — Z0000 Encounter for general adult medical examination without abnormal findings: Secondary | ICD-10-CM | POA: Diagnosis not present

## 2022-08-10 NOTE — Progress Notes (Unsigned)
Subjective:    Patient ID: Gary [name], male    DOB: 02/20/1956, 66 y.o.   MRN: [id number]  DOS:  08/10/2022 Type of visit - description: CPX  Here for CPX. No major concerns. He takes medications for diabetes hypertension and depression.  Names?. 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   Lipoma of arm 2016   Sleep apnea     Past Surgical History:  Procedure Laterality Date   COLONOSCOPY     INGUINAL HERNIA REPAIR  2000   unilateral   MASS EXCISION Left 08/04/2015   Procedure: EXCISION OF MASS ON LEFT ARM;  Surgeon: [name], MD;  Location: [facility];  Service: General;  Laterality: Left;   Social History   Socioeconomic History   Marital status: Widowed    Spouse name: Not on file   Number of children: 4   Years of education: Not on file   Highest education level: Not on file  Occupational History   Occupation: retired    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  Tobacco Use   Smoking status: Never   Smokeless tobacco: Never  Vaping Use   Vaping Use: Never used  Substance and Sexual Activity   Alcohol use: Yes    Alcohol/week: 0.0 standard drinks of alcohol    Comment: rarely   Drug use: No   Sexual activity: Not on file  Other Topics Concern   Not on file  Social History Narrative   Retired-  works for the [employer] (not sedentary)    lost wife 06-2014 , breast cancer   House hold: live by himself     Social Determinants of Radio broadcast assistant Strain: Not on file  Food Insecurity: Not on file  Transportation Needs: Not on file  Physical Activity: Not on file  Stress: Not on file  Social Connections: Not on file  Intimate Partner Violence: Not on file     Current Outpatient Medications  Medication Instructions   aspirin EC 81 mg, Oral, Daily   Calcium Carb-Cholecalciferol 600-10 MG-MCG TABS 1 tablet, Oral, Daily   cetirizine (ZYRTEC) 10 MG tablet 1 tablet, Oral, Daily at bedtime   DULoxetine (CYMBALTA) 60 MG capsule 1 capsule, Oral, Daily   fluticasone (FLONASE) 50 MCG/ACT nasal spray 1 spray, Each Nare, Daily   ketoconazole (NIZORAL) 2 % cream 1 application , Topical, Daily   losartan (COZAAR) 25 mg, Oral, Daily   naproxen (NAPROSYN) 500 MG tablet 1 tablet, Oral, 2 times daily, Take with food   Omega-3 Fatty Acids (FISH OIL) 1000 MG CAPS 1 capsule, Oral, Daily   OVER THE COUNTER MEDICATION Magnesium,  6 drops daily.    OVER THE COUNTER MEDICATION Chage-Black Cumin , 6 drops daily.    terbinafine (LAMISIL) 250 mg, Oral, Daily   traZODone (DESYREL) 100 MG tablet 1 tablet, Oral, At bedtime PRN       Objective:   Physical Exam BP 126/80   Pulse (!) 52   Temp 97.9 F (36.6 C) (Oral)   Resp 18   Ht '6\' 1"'$  (1.854 m)   Wt (!) 304 lb 2 oz (138 kg)  SpO2 97%   BMI 40.12 kg/m  General: Well developed, NAD, BMI noted Neck: No  thyromegaly  HEENT:  Normocephalic . Face symmetric, atraumatic Lungs:  CTA B Normal respiratory effort, no intercostal retractions, no accessory muscle use. Heart: RRR,  no murmur.  Abdomen:  Not distended, soft, non-tender. No rebound or rigidity.   Lower extremities: no pretibial edema bilaterally  Skin: Exposed areas without rash. Not pale. Not jaundice Neurologic:  alert & oriented X3.  Speech normal, gait appropriate for age and unassisted Strength symmetric and appropriate for age.  Psych: Cognition and judgment appear intact.  Cooperative with normal attention span and concentration.  Behavior appropriate. No anxious or depressed appearing.     Assessment     Assessment Diabetes, A1c 6.7 2006 Hyperlipidemia HTN? Depression Leiomyosarcoma, L arm  S/p excision 07-2015 , saw Dr  Valere Dross, rec wider excision, pt d/w surgery and declined  OSA, dx 2020: uses CPAP  Vertigo: Admitted 12-2018, MRI head, neck negative.     PLAN: Here for CPX Gets care at the [facility] regularly DM, hyperlipidemia, HTN?Marland Kitchen Reportedly takes medications for diabetes hypertension and depression.  Names? After extensive chart review we found that he has been prescribed: Duloxetine 60 mg, calcium vitamin D, fish oil, losartan 50 half tablet daily, trazodone 50 nightly as needed, naproxen BID Labs from 06/16/2022: Potassium 3.7, creatinine 1.2, calcium 8.4 (low).  LFTs normal hemoglobin 13.7, platelets 264. LDL 126. A1c 6.4.  TSH 0.7 Recommend to start cholesterol medication under the  direction of his [place] doctors, LDL is 126. OSA: Reports good CPAP compliance Depression: Reports he is doing okay, sees a Social worker, no suicidal ideas, PHQ-9 today: 9 RTC 1 year.

## 2022-08-10 NOTE — Patient Instructions (Addendum)
Vaccines I recommend:  Flu shot Covid booster Shingrix  Please talk with your [facility] doctor about starting a cholesterol medication  Consider  follow-up program to help with your diet: Options include GOLO, NOOM, weight watchers      GO TO THE FRONT DESK, [place] back for   a physical in 1 year    Do you have a "Living will" or "[place] of attorney"? (Advance care planning documents)  If you already have a living will or healthcare power of attorney, is recommended you bring the copy to be scanned in your chart. The document will be available to all the doctors you see in the system.  If you don't have one, please consider create one.  Advance care planning is a process that supports adults in  understanding and sharing their preferences regarding future medical care.   The patient's preferences are recorded in documents called Advance Directives and the can be modified at any time while the patient is in full mental capacity.   The documentation should be available at all times to the patient, the family and the healthcare providers.   This legal documents direct the family or surrogate to make the decision if the patient is not capable to do so.    Advance directives can be documented in many types of formats,  documents have names such as:  Lliving will  Durable power of attorney for healthcare (healthcare proxy or healthcare power of attorney)  Combined directives  Physician orders for life-sustaining treatment    More information at:  [email or web address]

## 2022-08-11 ENCOUNTER — Encounter: Payer: Self-pay | Admitting: Internal Medicine

## 2022-08-11 NOTE — Assessment & Plan Note (Signed)
-  Td 06-2019 - pnm 23: 2016 -Covid vax booster recommended, reluctant to proceed - shingrex: declined  - flu shot  : Declined - benefits of vaccinations discussed with the patient, pros >cons -CCS: +FH Cscope 2006 (-) no polyps,  Cscope 07-2010 (-); cscope 08/2019: wnl, 5 years, has a FH -prostate ca screening: No symptoms, DRE-PSA wnl 2022. -Diet  exercise: Extensive discussion about the need to have a structured diet, see AVS. -Labs: Reviewed.Marland Kitchen  He is seen regularly at the [facility]: Information provided

## 2022-08-11 NOTE — Assessment & Plan Note (Signed)
Here for CPX Gets care at the [facility] regularly DM, hyperlipidemia, HTN?Marland Kitchen Reportedly takes medications for diabetes hypertension and depression.  Names? After extensive chart review we found that he has been prescribed: Duloxetine 60 mg, calcium vitamin D, fish oil, losartan 50 half tablet daily, trazodone 50 nightly as needed, naproxen BID Labs from 06/16/2022: Potassium 3.7, creatinine 1.2, calcium 8.4 (low).  LFTs normal hemoglobin 13.7, platelets 264. LDL 126. A1c 6.4.  TSH 0.7 Recommend to start cholesterol medication under the  direction of his [place] doctors, LDL is 126. OSA: Reports good CPAP compliance Depression: Reports he is doing okay, sees a Social worker, no suicidal ideas, PHQ-9 today: 9 RTC 1 year.

## 2023-01-25 ENCOUNTER — Encounter: Payer: Self-pay | Admitting: *Deleted

## 2023-08-14 ENCOUNTER — Encounter: Payer: 59 | Admitting: Internal Medicine
